# Patient Record
Sex: Female | Born: 1940 | Race: White | Hispanic: No | Smoking: Never smoker
Health system: Southern US, Community
[De-identification: ages and names within clinical notes are randomized; demographics above are authoritative.]

## PROBLEM LIST (undated history)

## (undated) DIAGNOSIS — K219 Gastro-esophageal reflux disease without esophagitis: Secondary | ICD-10-CM

## (undated) DIAGNOSIS — F32A Depression, unspecified: Secondary | ICD-10-CM

## (undated) DIAGNOSIS — M1990 Unspecified osteoarthritis, unspecified site: Secondary | ICD-10-CM

## (undated) DIAGNOSIS — G4730 Sleep apnea, unspecified: Secondary | ICD-10-CM

## (undated) DIAGNOSIS — E7800 Pure hypercholesterolemia, unspecified: Secondary | ICD-10-CM

## (undated) DIAGNOSIS — E669 Obesity, unspecified: Secondary | ICD-10-CM

## (undated) DIAGNOSIS — R0602 Shortness of breath: Secondary | ICD-10-CM

## (undated) DIAGNOSIS — M810 Age-related osteoporosis without current pathological fracture: Secondary | ICD-10-CM

## (undated) DIAGNOSIS — C4490 Unspecified malignant neoplasm of skin, unspecified: Secondary | ICD-10-CM

## (undated) DIAGNOSIS — F329 Major depressive disorder, single episode, unspecified: Secondary | ICD-10-CM

## (undated) DIAGNOSIS — F419 Anxiety disorder, unspecified: Secondary | ICD-10-CM

## (undated) HISTORY — DX: Unspecified osteoarthritis, unspecified site: M19.90

## (undated) HISTORY — PX: ABDOMINAL HYSTERECTOMY: SHX81

## (undated) HISTORY — DX: Obesity, unspecified: E66.9

## (undated) HISTORY — DX: Pure hypercholesterolemia, unspecified: E78.00

## (undated) HISTORY — DX: Age-related osteoporosis without current pathological fracture: M81.0

## (undated) HISTORY — DX: Anxiety disorder, unspecified: F41.9

## (undated) HISTORY — PX: OTHER SURGICAL HISTORY: SHX169

## (undated) HISTORY — PX: CHOLECYSTECTOMY: SHX55

## (undated) HISTORY — PX: CATARACT EXTRACTION: SUR2

---

## 1999-09-13 ENCOUNTER — Other Ambulatory Visit: Admission: RE | Admit: 1999-09-13 | Discharge: 1999-09-13 | Payer: Self-pay | Admitting: Obstetrics and Gynecology

## 2000-09-15 ENCOUNTER — Other Ambulatory Visit: Admission: RE | Admit: 2000-09-15 | Discharge: 2000-09-15 | Payer: Self-pay | Admitting: Obstetrics and Gynecology

## 2000-09-26 ENCOUNTER — Other Ambulatory Visit: Admission: RE | Admit: 2000-09-26 | Discharge: 2000-09-26 | Payer: Self-pay | Admitting: Obstetrics and Gynecology

## 2001-10-09 ENCOUNTER — Other Ambulatory Visit: Admission: RE | Admit: 2001-10-09 | Discharge: 2001-10-09 | Payer: Self-pay | Admitting: Obstetrics and Gynecology

## 2002-06-04 ENCOUNTER — Ambulatory Visit (HOSPITAL_COMMUNITY): Admission: RE | Admit: 2002-06-04 | Discharge: 2002-06-04 | Payer: Self-pay | Admitting: Gastroenterology

## 2002-12-20 ENCOUNTER — Encounter: Admission: RE | Admit: 2002-12-20 | Discharge: 2002-12-20 | Payer: Self-pay | Admitting: Internal Medicine

## 2002-12-20 ENCOUNTER — Encounter: Payer: Self-pay | Admitting: Internal Medicine

## 2004-01-09 ENCOUNTER — Other Ambulatory Visit: Admission: RE | Admit: 2004-01-09 | Discharge: 2004-01-09 | Payer: Self-pay | Admitting: Obstetrics and Gynecology

## 2005-05-06 ENCOUNTER — Inpatient Hospital Stay (HOSPITAL_COMMUNITY): Admission: AD | Admit: 2005-05-06 | Discharge: 2005-05-14 | Payer: Self-pay | Admitting: Internal Medicine

## 2005-05-11 ENCOUNTER — Ambulatory Visit: Payer: Self-pay | Admitting: Hematology and Oncology

## 2006-04-09 ENCOUNTER — Encounter: Admission: RE | Admit: 2006-04-09 | Discharge: 2006-04-09 | Payer: Self-pay | Admitting: Internal Medicine

## 2007-03-07 ENCOUNTER — Emergency Department (HOSPITAL_COMMUNITY): Admission: EM | Admit: 2007-03-07 | Discharge: 2007-03-07 | Payer: Self-pay | Admitting: Emergency Medicine

## 2007-03-09 ENCOUNTER — Ambulatory Visit (HOSPITAL_COMMUNITY): Admission: RE | Admit: 2007-03-09 | Discharge: 2007-03-09 | Payer: Self-pay | Admitting: Emergency Medicine

## 2010-08-09 ENCOUNTER — Emergency Department (HOSPITAL_COMMUNITY): Admission: EM | Admit: 2010-08-09 | Discharge: 2010-08-09 | Payer: Self-pay | Admitting: Emergency Medicine

## 2010-10-12 ENCOUNTER — Ambulatory Visit (HOSPITAL_COMMUNITY)
Admission: RE | Admit: 2010-10-12 | Discharge: 2010-10-12 | Payer: Self-pay | Source: Home / Self Care | Attending: Surgery | Admitting: Surgery

## 2010-10-15 LAB — CBC
HCT: 43.3 % (ref 36.0–46.0)
Hemoglobin: 14.8 g/dL (ref 12.0–15.0)
MCH: 31.8 pg (ref 26.0–34.0)
MCHC: 34.2 g/dL (ref 30.0–36.0)
MCV: 93.1 fL (ref 78.0–100.0)
Platelets: 208 10*3/uL (ref 150–400)
RBC: 4.65 MIL/uL (ref 3.87–5.11)
RDW: 13.5 % (ref 11.5–15.5)
WBC: 6.7 10*3/uL (ref 4.0–10.5)

## 2010-10-15 LAB — COMPREHENSIVE METABOLIC PANEL
ALT: 36 U/L — ABNORMAL HIGH (ref 0–35)
AST: 34 U/L (ref 0–37)
Albumin: 4.6 g/dL (ref 3.5–5.2)
Alkaline Phosphatase: 96 U/L (ref 39–117)
BUN: 14 mg/dL (ref 6–23)
CO2: 28 mEq/L (ref 19–32)
Calcium: 9.8 mg/dL (ref 8.4–10.5)
Chloride: 106 mEq/L (ref 96–112)
Creatinine, Ser: 0.87 mg/dL (ref 0.4–1.2)
GFR calc Af Amer: >60 mL/min (ref >60)
GFR calc non Af Amer: >60 mL/min (ref >60)
Glucose, Bld: 100 mg/dL — ABNORMAL HIGH (ref 70–99)
Potassium: 4.3 mEq/L (ref 3.5–5.1)
Sodium: 140 mEq/L (ref 135–145)
Total Bilirubin: 0.8 mg/dL (ref 0.3–1.2)
Total Protein: 6.7 g/dL (ref 6.0–8.3)

## 2010-10-15 LAB — SURGICAL PCR SCREEN
MRSA, PCR: NEGATIVE
Staphylococcus aureus: NEGATIVE

## 2010-12-11 LAB — URINALYSIS, ROUTINE W REFLEX MICROSCOPIC
Bilirubin Urine: NEGATIVE
Glucose, UA: NEGATIVE mg/dL
Hgb urine dipstick: NEGATIVE
Ketones, ur: NEGATIVE mg/dL
Nitrite: NEGATIVE
Protein, ur: NEGATIVE mg/dL
Specific Gravity, Urine: 1.02 (ref 1.005–1.030)
Urobilinogen, UA: 0.2 mg/dL (ref 0.0–1.0)
pH: 6.5 (ref 5.0–8.0)

## 2010-12-11 LAB — CBC
HCT: 39.1 % (ref 36.0–46.0)
Hemoglobin: 13.6 g/dL (ref 12.0–15.0)
MCH: 32.6 pg (ref 26.0–34.0)
MCHC: 34.8 g/dL (ref 30.0–36.0)
MCV: 93.5 fL (ref 78.0–100.0)
Platelets: 150 10*3/uL (ref 150–400)
RBC: 4.19 MIL/uL (ref 3.87–5.11)
RDW: 13.6 % (ref 11.5–15.5)
WBC: 8.1 10*3/uL (ref 4.0–10.5)

## 2010-12-11 LAB — COMPREHENSIVE METABOLIC PANEL
ALT: 27 U/L (ref 0–35)
AST: 30 U/L (ref 0–37)
Albumin: 3.7 g/dL (ref 3.5–5.2)
Alkaline Phosphatase: 79 U/L (ref 39–117)
Chloride: 106 mEq/L (ref 96–112)
Potassium: 3.9 mEq/L (ref 3.5–5.1)
Sodium: 139 mEq/L (ref 135–145)
Total Bilirubin: 0.8 mg/dL (ref 0.3–1.2)
Total Protein: 6.1 g/dL (ref 6.0–8.3)

## 2010-12-11 LAB — DIFFERENTIAL
Basophils Relative: 0 % (ref 0–1)
Eosinophils Absolute: 0 10*3/uL (ref 0.0–0.7)
Eosinophils Relative: 0 % (ref 0–5)
Monocytes Absolute: 0.4 10*3/uL (ref 0.1–1.0)
Monocytes Relative: 4 % (ref 3–12)

## 2011-02-15 NOTE — Consult Note (Signed)
NAMEHARBOUR, NORDMEYER              ACCOUNT NO.:  [id number]   MEDICAL RECORD NO.:  [id number]          PATIENT TYPE:  INP   LOCATION:  5503                         FACILITY:  [facility]   PHYSICIAN:  Adolph Pollack, M.D.DATE OF BIRTH:  August 10, 1941   DATE OF CONSULTATION:  05/06/2005  DATE OF DISCHARGE:                                   CONSULTATION   REASON FOR VISIT:  Abdominal pain and bloody diarrhea.   HISTORY OF PRESENT ILLNESS:  This is a 70 year old female with a six-day  history of bloody diarrhea and abdominal pain in the lower abdomen.  It  comes on as cramps before the diarrhea.  She had taken care of her  granddaughter a week prior to the onset of her illness, who had the exact  same thing, and this eventually resolved.  She was cleaning up her  granddaughter's diarrhea at that time.  She saw Dr. Pete Glatter and was sent  to the hospital for admission, and we were asked to see her.  She is very  thirsty, has not been eating or drinking much.   PAST MEDICAL HISTORY:  1.  Anxiety.  2.  Depression.  3.  Gastroesophageal reflux disease.   PREVIOUS OPERATIONS:  1.  Repair of nasal septum.  2.  TAH/BSO.  3.  Laparoscopy.   ALLERGIES:  None.   MEDICATIONS:  BuSpar, Celexa, Prilosec, aspirin, calcium, multivitamin.   SOCIAL HISTORY:  Denies tobacco or alcohol use.  Lives in [place].  Daughter and granddaughter are with her.  She works at the [employer].   REVIEW OF SYSTEMS:  CARDIOVASCULAR:  She denies any heart disease,  hypertension or vascular disease.  PULMONARY:  She denies asthma, pneumonia,  tuberculosis.  GASTROINTESTINAL:  She states she has diverticulosis and had  a colonoscopy by Tasia Catchings, M.D.  Denies hepatitis or diverticulitis.  ENDOCRINE:  Denies diabetes and hypercholesterolemia and no thyroid disease.  NEUROLOGIC:  Denies strokes or seizure.  HEMATOLOGIC:  Denies any known  bleeding disorders or blood clots.   PHYSICAL EXAMINATION:   GENERAL:  An anxious female but she does not appear  to be toxic.  VITAL SIGNS:  Temperature is 97.9, blood pressure is 120/70, pulse 70,  respiratory rate 20.  HEENT:  Extraocular motions intact.  No icterus.  Mucous membranes are dry.  NECK:  Supple without obvious masses.  RESPIRATORY:  Breath sounds equal and clear, respirations are unlabored.  CARDIOVASCULAR:  Regular rate and rhythm, no murmur, gallop or rub.  Strong  pedal pulses present.  ABDOMEN:  Soft.  There is some right lower quadrant and suprapubic  tenderness and mild left lower quadrant tenderness.  A mild amount of  guarding in the right lower quadrant region.  Hyperactive bowel sounds are  heard.  A lower transverse scar is noted.  A small subumbilical scar noted.  SKIN:  Warm and dry.  EXTREMITIES:  No edema.   LABORATORY DATA:  Pending but apparently over at Dr. Laverle Hobby office, she  had a potassium of 3.3 and a white blood cell count of 16,000.  CT scan has  been ordered by Dr. Pete Glatter.   IMPRESSION:  1.  Abdominal pain and bloody stools similar to a recent illness that her      granddaughter had, most likely infectious type of colitis versus      gastroenteritis.  Other potential etiologies include diverticulitis with      bleeding or ischemic colitis.  2.  Severe dehydration.  3.  Hypokalemia.   RECOMMENDATIONS:  1.  Would keep n.p.o.  2.  Agree with the CT of the abdomen and pelvis.  3.  Labs have been ordered and are pending.  4.  Would aggressively hydrate her.  5.  Will check on the CT after it has been done.       TJR/MEDQ  D:  05/06/2005  T:  05/07/2005  Job:  [id number]   cc:   Hal T. Stoneking, M.D.  [address] [place], [place] [address]  Fax: [phone]

## 2011-02-15 NOTE — Consult Note (Signed)
NAME:  Christie Harris, Christie Harris              ACCOUNT NO.:  [id number]   MEDICAL RECORD NO.:  [id number]          PATIENT TYPE:  INP   LOCATION:  5014                         FACILITY:  [facility]   PHYSICIAN:  Lauretta I. Odogwu, M.D.DATE OF BIRTH:  1940-10-26   DATE OF CONSULTATION:  05/11/2005  DATE OF DISCHARGE:                                   CONSULTATION   REASON FOR CONSULTATION:  Thrombocytopenia.   REFERRING PHYSICIAN:  Dr. Ladona Ridgel.   HISTORY OF PRESENT ILLNESS:  Christie Harris is a 70 year old, white female  admitted to [facility] with a 6-day history of bloody diarrhea and  lower abdominal pain following outpatient evaluation.  Her grand daughter  had contacted a similar illness but recovered. She received aggressive  hydration and electrolyte replenishment.  A CT of the abdomen performed  showed significant thickening of the wall of the descending colon and  pericolonic inflammatory changes.  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  She was placed on IV antibiotics.  CBC on  admission, May 06, 2005, was normal with a hemoglobin of 13.6 and  hematocrit of 39.1, platlets 245.  Platlets began a steady decline; May 10, 2005 platlets 125, August 12, platlets 89.  Her peripheral smear demonstrates very few schizocytes and helmet cells, and  was oterwise normal.   PAST MEDICAL HISTORY:  1.  Anxiety disorder.  2.  Depression.  3.  GERD.  4.  Borderline hypertension.  5.  Mild obesity.   PAST SURGICAL HISTORY:  1.  Status post repair of nasal septum.  2.  Status post TAH/BSO.  3.  Status post laparoscopy.   ALLERGIES:  No known drug allergies.   MEDICATIONS:  1.  BuSpar 50 mg b.i.d.  2.  Calcium carbonate 300 mg q.d.  3.  Cipro IV 400 mg q.12h.  4.  Celexa 20 mg q.d.  5.  Flagyl 500 mg q.d.  6.  Protonix 40 mg q.d.  7.  Zofran 4 mg q.4h. p.r.n.  8.  Lortab p.r.n.  9.  Morphine sulfate p.r.n.  10. Tylenol p.r.n.  11. IV fluids at  50 an hour.   REVIEW OF SYSTEMS:  CONSTITUTIONAL:  Fatigue, headaches until last night.  GASTROINTESTINAL:  Abdominal pain and diarrhea resolving. Pateint denies  nausea.  GENITOURINARY:  Denies hematuria and nocturia. The rest of the  review of systems is negative.   FAMILY HISTORY:  Mother died with some type of cancer at 66.  Father died at  74 with CHF.  No history of cancer or hematologic malignancies in the  family.   SOCIAL HISTORY:  The patient is divorced.  She has one daughter and one son  in good health.  She works as a Comptroller.  She lives in [place].  Denies  alcohol and tobacco use.   PHYSICAL EXAMINATION:  GENERAL:  This is a 70 year old, white female in no  acute distress, highly anxious and tearful, alert and oriented x3.  VITAL SIGNS:  Blood pressure 134/68, pulse 76, respirations 20, temperature  97.7, weight 79 kg, height 67 inches.  SKIN:  Normocephalic, atraumatic.  PERRLA.  Oral mucosa without thrush or  lesions.  NECK:  Supple.  No cervical or supraclavicular masses.  LUNGS:  Decreased breath sounds at the bases.  No wheezes, no rhonchi, no  rales, no axillary masses.  CARDIAC:  Regular rate and rhythm without murmurs, rubs or gallops.  ABDOMEN:  Distended, soft, and mildly tender at the epigastrium.  No  palpable spleen or liver. Bowel sounds present.  EXTREMITIES:  No clubbing, cyanosis or edema.  SKIN:  Without lesions, bruising or petechiae.  NEUROLOGIC:  Nonfocal.   LABORATORY DATA AND X-RAY FINDINGS:  Hemoglobin 10.4, hematocrit 29.4, white  count 6.3, platelets 89, neutrophils 4.8, MCV 90.5.  PT 16.4, PTT 35, INR  1.3.  Fibrinogen 470.  D-dimer 3.27.  LDH, reticulocyte count and  haptoglobin is pending.  Sodium 139, potassium 3.8, BUN 16, creatinine 1.3,  glucose 108.  Total bilirubin 1.9, Alk phos 72, AST 46, ALT 24, total  protein 4.2, albumin 2.2, calcium 8.0.   RECOMMENDATIONS:  TTP/HUS is not consistent with the findings.  No evidence  of  acute disseminated intravascular coagulation.  The findings are  consistent with underlying illness, primarily infection.  Will follow the  counts daily.  Will check her LDH.  Repeat the smear.  Check her hepatitis  panel based on abnormal LFTs.   Thank you very much for allowing [place] the opportunity to participate in Ms.  Harris's care.      Marlowe Kays, P.A.      Lauretta I. Odogwu, M.D.  Electronically Signed    SW/MEDQ  D:  05/13/2005  T:  05/13/2005  Job:  [id number]   cc:   Bernette Redbird, M.D.  [address] [place]  [place], [place] [address]  Fax: [phone]

## 2011-02-15 NOTE — Discharge Summary (Signed)
NAME:  Christie Harris, Christie Harris              ACCOUNT NO.:  [id number]   MEDICAL RECORD NO.:  [id number]          PATIENT TYPE:  INP   LOCATION:  5014                         FACILITY:  [facility]   PHYSICIAN:  Melissa L. Ladona Ridgel, MD  DATE OF BIRTH:  11-30-1940   DATE OF ADMISSION:  05/06/2005  DATE OF DISCHARGE:  05/14/2005                                 DISCHARGE SUMMARY   DISCHARGE DIAGNOSES:  1.  Enteritis with diarrhea.  Patient was admitted from the general      practitioner's office with the acute onset of abdominal pain and bloody      diarrhea.  She had had exposure to a granddaughter who had similar      symptoms.  The patient was found to be significantly dehydrated and      therefore was admitted for further care to the hospital.  Patient's      diarrhea ultimately resolved, however, no organism was noted to be      growing in her cultures.  She was initially treated with IV antibiotics.      These, at the end of the course of her stay, were discontinued.  She was      evaluated by GI and follow-up arrangements were made at the time of      discharge for her as an outpatient.  2.  Anxiety and depression.  The patient's BuSpar and Celexa needed to be      held secondary to her inability to keep food down.  As a result of off      her BuSpar, the patient developed a significant neurological tremor at      the end of her discharge.  CT of the head was negative and with      resumption of her medication, the tremor was reported at the time of      discharge to be improved, and likely represented withdraw symptoms from      her medications.  3.  Thrombocytopenia.  The patient received deep venous thrombosis      prophylaxis during the course of hospital stay but developed a decrease      in her platelets which was evaluated by hematology/oncology.  It was      felt that her condition was likely secondary to critical illness and not      consistent with thrombotic thrombocytopenia  purpura-hemolytic uremic      syndrome.  Her liver function tests, LDH, PT/INR were therefore followed      with improvement by the time of discharge.  4.  Lower extremity pain.  Patient was evaluated for deep venous thrombosis.      Dopplers and lower extremities showed no evidence for deep venous      thrombosis.  5.  Slight renal insufficiency.  The patient was rehydrated with good      results.  No further elevations in her creatinine were noted.   DISCHARGE MEDICATIONS:  1.  BuSpar 50 mg b.i.d.  2.  Celexa 20 mg daily.  3.  Prilosec 20 mg daily.  4.  Multivitamin once daily.  5.  Calcium 650 daily.  6.  Aspirin was to be held for two weeks.   Patient is to follow up with GI in two weeks and to call her primary care  physician for an appointment.   HISTORY OF PRESENT ILLNESS:  Patient was a 70 year old white female who had  recently cared for her granddaughter who developed severe diarrhea.  The  patient, in question, soon thereafter developed profuse diarrhea with blood.  She, therefore, was admitted to the hospital for further rehydration and  care.  During the course of the hospital stay, the patient was evaluated by  critical care surgery for right-sided abdominal pain and bloating.  She was  conservatively treated with IV fluids and bowel rest.  A GI consult was  requested and it was decided to add Flagyl to her Cipro and add Probiotic.  Patient did not undergo colonoscopy initially and it was decided that she  would follow up as an outpatient for further testing.  Patient's course was  mired by increased abdominal distention which was remedied with rectal  decompression.  She continued to be treated with Cipro and Flagyl.  The  patient's stool cultures grew no specific bacteria.   On May 10, 2005, patient's platelets were noted to be decreased with mild  elevation in her BUN and creatinine.   On May 11, 2005, patient was evaluated for possible TTP and HUS.  The   final outcome determined that the patient likely had thrombocytopenia  secondary to critical illness.  The patient continued to be treated with IV  antibiotics and her laboratory values were monitored.  Her colitis appeared  to be resolving with mild lingering ileus.   On May 12, 2005, consideration was made of discontinuing her antibiotics  to follow her white counts and fevers.  She was placed on a lactose-free  diet and progressed on to full diet before discharge.  Of note, the patient  developed an intention tremor which was quite severe and debilitating.  With  the replacement of her BuSpar and Celexa as well as mild anti-anxiolytic,  the patient's symptoms resolved by the time of discharge.   On May 14, 2005, the patient is noted to be stable, in no acute distress.  Cardiovascular regular rate and rhythm.  Abdomen nontender.  Neurologically,  she has improved intention tremor.  It was therefore decided the patient  could be discharged to home with follow-up as an outpatient.   PERTINENT LABORATORY DATA:  Discharge white count 5.9 with hemoglobin 9.7,  hematocrit 27.9, and platelets 111.  Her sodium was 140, potassium 4.0,  chloride 111, CO2 26, glucose 102, BUN 19, creatinine 1.3.  Haptoglobin was  less than 6.  Folate was normal.  Her urinalysis was negative.  Blood  cultures were negative.  Stool cultures grew no Salmonella shigellae,  campylobacter or Yersinia and her C. difficile toxin was negative.  Her  random blood urea nitrogen level was 360 within normal limits.   Patient had a Doppler that showed no evidence of DVT, cyst or thrombosis.  CT of the abdomen was performed that showed marked wall thickening of the  ascending colon, likely due to infectious colitis.  She had a small right  pleural effusion.   At the time of discharge, my partner felt that she was stable enough to  follow up as an outpatient.  We therefore discharged to home.  At this time, the patient  is deemed stable to follow up with her primary  care  physician, Dr. Ladell Pier.      Melissa L. Ladona Ridgel, MD  Electronically Signed     MLT/MEDQ  D:  07/20/2005  T:  07/21/2005  Job:  [id number]   cc:   Ladell Pier, M.D.  Fax: [phone]

## 2011-02-15 NOTE — H&P (Signed)
NAME:  Christie Harris, Christie Harris              ACCOUNT NO.:  [id number]   MEDICAL RECORD NO.:  [id number]          PATIENT TYPE:  INP   LOCATION:  5503                         FACILITY:  [facility]   PHYSICIAN:  Hal T. Stoneking, M.D. DATE OF BIRTH:  1941-04-13   DATE OF ADMISSION:  05/06/2005  DATE OF DISCHARGE:                                HISTORY & PHYSICAL   CHIEF COMPLAINT:  A 70 year old white female with a one-week history of  bloody diarrhea.  She developed nausea and vomiting over the last 24-48  hours and along with general abdominal pain and swelling, and decided to be  seen.   A granddaughter had similar symptoms which resolved spontaneously.   Laboratories in the office showed dehydration and infection with a sodium of  129, potassium of 3.3, and a white count of 16,100.   She has no food source, water source, travel, or antibiotics.   PAST MEDICAL HISTORY:   ALLERGIES:  NKDA.   CURRENT MEDICATIONS:  1. BuSpar 15 mg b.i.d.  2. Celexa 20 mg one daily.  3. Prilosec over-the-counter 20 mg one daily.  4. Multivitamin one daily.  5. Calcium 600 mg q.a.m.  6. Aspirin 81 mg one daily.   MEDICAL HISTORY:  1. GERD.  2. Anxiety.  3. Depression.  4. Postmenopausal.  5. Headache.  6. Borderline blood pressure.  7. Mild obesity.   SURGERIES:  1. Nasal septum repair.  2. Status post total abdominal hysterectomy with bilateral salpingo-      oophorectomy.   FAMILY HISTORY:  Mother died at age 62 of unknown cancer with GI problems as  well including peptic ulcer and bleeding ulcer.  Father died at age 72 of  CHF.   SOCIAL HISTORY:  She never smoked.  She drinks occasionally.  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  She has one son and one  daughter and she is divorced.  She is raising her grandchild.   REVIEW OF SYSTEMS:  Noncontributory except for chief complaint.   PHYSICAL EXAMINATION:  GENERAL APPEARANCE:  Acutely ill female in acute  distress.  VITAL SIGNS:  Temperature 97.9, pulse 72, respirations 18, blood pressure  120/70.  HEENT:  PERRL.  Ears clear.  Nose and throat clear.  NECK:  Supple.  CHEST:  Clear to auscultation.  BREASTS:  Deferred.  ABDOMEN:  Distended, tender generally with positive peritoneal sign.  No  masses or organomegaly.  PELVIC:  Deferred.  RECTAL:  Guaiac-positive.  EXTREMITIES:  No edema.   IMPRESSION:  Abdominal pain with bloody diarrhea, questionable etiology.  Admit for intravenous hydration and evaluation of abdominal pain including a  surgical consult.      Mary   MAP/MEDQ  D:  05/06/2005  T:  05/06/2005  Job:  [id number]

## 2011-02-15 NOTE — Consult Note (Signed)
NAME:  Christie Harris, ROCQUE              ACCOUNT NO.:  [id number]   MEDICAL RECORD NO.:  [id number]          PATIENT TYPE:  INP   LOCATION:  5503                         FACILITY:  [facility]   PHYSICIAN:  Bernette Redbird, M.D.   DATE OF BIRTH:  05/26/1941   DATE OF CONSULTATION:  05/07/2005  DATE OF DISCHARGE:                                   CONSULTATION   Dr. Derenda Mis the [facility] asked [place] to see this 70 year old  female because of hemorrhagic diarrhea.   Christie Harris was admitted to the hospital yesterday following a roughly one-  week prodrome of diarrhea and diffuse body aches associated with minimal  vomiting.  The diarrhea began fairly abruptly, about a week after her  granddaughter, with whom she had taken a trip to the beach, came down with a  similar illness.  The diarrhea became bloody in character, up to as many as  eight times a day, including nocturnal diarrhea, associated with significant  low abdominal discomfort which has now migrated somewhat more superiorly,  although the diarrhea is much improved (only one bowel movement so far  today, and it was nonbloody).   A CT scan obtained on admission showed significant thickening of the wall of  the descending colon with pericolonic inflammatory changes.   No obvious risk factors for infectious colitis such as foreign travel, no  family history of inflammatory bowel disease.   PAST MEDICAL HISTORY:  No allergies.   OUTPATIENT MEDICATIONS:  BuSpar, Celexa, Prilosec OTC, calcium supplements,  daily aspirin, and p.r.n. NSAIDS.   OPERATIONS:  Remote hysterectomy and oophorectomy.   MEDICAL PROBLEMS:  Anxiety and depression, borderline blood pressure, GERD.   HABITS:  Moderate ethanol, nonsmoker.   FAMILY HISTORY:  Negative for GI illnesses.   SOCIAL HISTORY:  Works as a Comptroller at [employer].   REVIEW OF SYSTEMS:  The patient did have some diffuse body aches in  association with the current illness,  but no mouth sores or joint effusions.  At baseline, she averages one bowel movement daily.  She has had a screening  colonoscopy within the past five years by Dr. Sherin Quarry.   PHYSICAL EXAMINATION:  GENERAL:  A very pleasant, articulate Caucasian  female in no evident distress, appearing neither anxious nor depressed.  VITAL SIGNS:  Pertinent for absence of fever since admission, pulse 85,  blood pressure 112/63.  SKIN:  Anicteric. No frank pallor.  CHEST:  Clear.  HEART:  Normal.  ABDOMEN:  Normal bowel sounds, is slightly protuberant and, by the patient's  report, somewhat distended, slightly firm but not tense or rigid, no  exquisite tenderness.  No rebound.  RECTAL:  No evident perianal disease.  Bimanual palpation of the coccygeal  region unremarkable (the patient has sensed a fullness in that area  recently).  No obvious masses in the rectum, and the rectal ampulla was  empty and devoid of stool but there was mucoid brown liquid stool on the  examining glove.   Labs white count 11,500 (was 15,600 yesterday) post-hydration hemoglobin  11.4, platelets 209,000.  INR normal.  Chemistry profile pertinent  for  normal liver chemistries, albumin 2.8.  On presentation, BUN was 16 but it  has fallen to 9 with hydration, creatinine 0.9, potassium 3.1 on admission,  3.6 today.   CT scan:  I reviewed the CT scan with the radiologist and concur with the  impression of proximal colonic thickening.  No obvious vascular  calcifications to cause is an ischemic colitis.  No toxic dilatation of the  colon.   IMPRESSION:  By history, this is most compatible with an infectious  hemorrhagic (dysenteric) colitis with associated diarrhea, currently in the  resolving phase.   RECOMMENDATIONS:  Supportive care.  I agree with the empiric use of  antibiotics for the time being and would add Flagyl to the regimen of Cipro  which was started yesterday.  The Flagyl has subsequently been started   following my discussion with Dr. Derenda Mis.  I would also favor  probiotic supplementation and trying the patient on sips of fluid with the  hope that a clear liquid diet could be tolerated tomorrow.   At present, I would hold off on colonoscopy because it would be at increased  risk for complications given the inflamed status of the colon.  However, I  would consider colonoscopy if the patient markedly worsens and our hand is  forced or if the current clinical problem persists and lingers, which I  doubt will occur.   I appreciate the opportunity of seeing this patient in consultation with  you.       RB/MEDQ  D:  05/07/2005  T:  05/08/2005  Job:  [id number]   cc:   Hal T. Stoneking, M.D.  [address] [place], [place] [address]  Fax: [phone]

## 2011-07-18 LAB — D-DIMER, QUANTITATIVE: D-Dimer, Quant: 0.28

## 2011-07-18 LAB — I-STAT 8, (EC8 V) (CONVERTED LAB)
Acid-Base Excess: 2
Chloride: 108
HCT: 42
Potassium: 4.2
pH, Ven: 7.419 — ABNORMAL HIGH

## 2011-07-18 LAB — COMPREHENSIVE METABOLIC PANEL
ALT: 33
BUN: 18
CO2: 27
Calcium: 9.4
Creatinine, Ser: 0.91
GFR calc non Af Amer: >60
Glucose, Bld: 109 — ABNORMAL HIGH
Sodium: 141

## 2011-07-18 LAB — POCT CARDIAC MARKERS
Myoglobin, poc: 137
Operator id: 151321

## 2011-07-18 LAB — LIPASE, BLOOD: Lipase: 30

## 2011-07-18 LAB — POCT I-STAT CREATININE: Creatinine, Ser: 1

## 2012-11-25 ENCOUNTER — Other Ambulatory Visit: Payer: Self-pay | Admitting: Gastroenterology

## 2013-01-13 ENCOUNTER — Other Ambulatory Visit: Payer: Self-pay | Admitting: Gastroenterology

## 2013-03-08 ENCOUNTER — Encounter (HOSPITAL_COMMUNITY): Payer: Self-pay | Admitting: *Deleted

## 2013-03-11 ENCOUNTER — Encounter (HOSPITAL_COMMUNITY): Payer: Self-pay | Admitting: Pharmacy Technician

## 2013-03-23 ENCOUNTER — Encounter (HOSPITAL_COMMUNITY): Payer: Self-pay | Admitting: *Deleted

## 2013-03-23 ENCOUNTER — Ambulatory Visit (HOSPITAL_COMMUNITY)
Admission: RE | Admit: 2013-03-23 | Discharge: 2013-03-23 | Disposition: A | Discharge status: Home / Self Care | Payer: Medicare PPO | Source: Ambulatory Visit | Attending: Gastroenterology | Admitting: Gastroenterology

## 2013-03-23 ENCOUNTER — Encounter (HOSPITAL_COMMUNITY): Payer: Self-pay | Admitting: Anesthesiology

## 2013-03-23 ENCOUNTER — Ambulatory Visit (HOSPITAL_COMMUNITY): Payer: Medicare PPO | Admitting: Anesthesiology

## 2013-03-23 ENCOUNTER — Encounter (HOSPITAL_COMMUNITY)
Admission: RE | Disposition: A | Discharge status: Home / Self Care | Payer: Self-pay | Source: Ambulatory Visit | Attending: Gastroenterology

## 2013-03-23 DIAGNOSIS — E7800 Pure hypercholesterolemia, unspecified: Secondary | ICD-10-CM | POA: Insufficient documentation

## 2013-03-23 DIAGNOSIS — G4733 Obstructive sleep apnea (adult) (pediatric): Secondary | ICD-10-CM | POA: Insufficient documentation

## 2013-03-23 DIAGNOSIS — M810 Age-related osteoporosis without current pathological fracture: Secondary | ICD-10-CM | POA: Insufficient documentation

## 2013-03-23 DIAGNOSIS — Z1211 Encounter for screening for malignant neoplasm of colon: Secondary | ICD-10-CM | POA: Insufficient documentation

## 2013-03-23 DIAGNOSIS — D126 Benign neoplasm of colon, unspecified: Secondary | ICD-10-CM | POA: Insufficient documentation

## 2013-03-23 DIAGNOSIS — K219 Gastro-esophageal reflux disease without esophagitis: Secondary | ICD-10-CM | POA: Insufficient documentation

## 2013-03-23 HISTORY — DX: Unspecified osteoarthritis, unspecified site: M19.90

## 2013-03-23 HISTORY — PX: COLONOSCOPY WITH PROPOFOL: SHX5780

## 2013-03-23 HISTORY — DX: Shortness of breath: R06.02

## 2013-03-23 HISTORY — DX: Major depressive disorder, single episode, unspecified: F32.9

## 2013-03-23 HISTORY — DX: Gastro-esophageal reflux disease without esophagitis: K21.9

## 2013-03-23 HISTORY — DX: Sleep apnea, unspecified: G47.30

## 2013-03-23 HISTORY — DX: Depression, unspecified: F32.A

## 2013-03-23 SURGERY — COLONOSCOPY WITH PROPOFOL
Anesthesia: Monitor Anesthesia Care

## 2013-03-23 MED ORDER — LACTATED RINGERS IV SOLN
INTRAVENOUS | Status: DC
  Administered 2013-03-23: 1000 mL via INTRAVENOUS

## 2013-03-23 MED ORDER — SODIUM CHLORIDE 0.9 % IV SOLN: INTRAVENOUS | Status: DC

## 2013-03-23 MED ORDER — PROMETHAZINE HCL 25 MG/ML IJ SOLN: 6.2500 mg | INTRAMUSCULAR | Status: DC | PRN

## 2013-03-23 MED ORDER — PROPOFOL INFUSION 10 MG/ML OPTIME
INTRAVENOUS | Status: DC | PRN
  Administered 2013-03-23: 75 ug/kg/min via INTRAVENOUS

## 2013-03-23 MED ORDER — KETAMINE HCL 50 MG/ML IJ SOLN
INTRAMUSCULAR | Status: DC | PRN
  Administered 2013-03-23: 10 mg via INTRAMUSCULAR
  Administered 2013-03-23: 15 mg via INTRAMUSCULAR

## 2013-03-23 MED ORDER — PROPOFOL 10 MG/ML IV BOLUS
INTRAVENOUS | Status: DC | PRN
  Administered 2013-03-23: 50 mg via INTRAVENOUS
  Administered 2013-03-23 (×2): 20 mg via INTRAVENOUS

## 2013-03-23 SURGICAL SUPPLY — 21 items

## 2013-03-23 NOTE — H&P (Signed)
  Procedure: Surveillance colonoscopy  History: The patient is a 72 year old female born 1941-09-07. The patient has a history of adenomatous colon polyps removed colonoscopically. She is scheduled to undergo a surveillance colonoscopy today.  Past medical history: Obstructive sleep apnea syndrome. Depression. Hypercholesterolemia. Gastroesophageal reflux. Osteoporosis. Bilateral hearing loss. Hysterectomy. Oophorectomy. Tonsillectomy. Cholecystectomy. Nasal reconstruction surgery to  Habits: Darvocet causes hallucinations.  Exam: The patient is alert and lying comfortably on the endoscopy stretcher. Lungs are clear to auscultation. Cardiac exam reveals a regular rhythm. Abdomen is soft, flat, and nontender to palpation.  Plan: Proceed with surveillance colonoscopy

## 2013-03-23 NOTE — Op Note (Signed)
Procedure: Surveillance colonoscopy  Endoscopist: Danise Edge  Premedication: Propofol administered by anesthesia  Procedure: The patient was placed in the left lateral decubitus position. Anal inspection and digital rectal exam were normal. The Pentax pediatric colonoscope was introduced into the rectum and advanced to the cecum. A normal-appearing ileocecal valve and appendiceal orifice were identified. Colonic preparation for the exam today was good.  Rectum. Normal. Retroflexed view of the distal rectum normal.  Sigmoid colon and descending colon. Left colonic diverticulosis. A 3 mm sessile polyp was removed from the distal sigmoid colon with the cold biopsy forceps.  Splenic flexure. Normal.  Transverse colon. From the mid transverse colon a 4 mm sessile polyp was removed with the cold biopsy forceps  Hepatic flexure. Normal.  Ascending colon. Normal.  Cecum and ileocecal valve. Normal.  Assessment: A small polyp was removed from the transverse colon and a small polyp was removed from the distal sigmoid colon. Both polyps were submitted in one bottle for pathological evaluation.  Recommendations: Schedule surveillance colonoscopy in 5 years.

## 2013-03-23 NOTE — Transfer of Care (Signed)
Immediate Anesthesia Transfer of Care Note  Patient: Christie Harris  Procedure(s) Performed: Procedure(s) (LRB): COLONOSCOPY WITH PROPOFOL (N/A)  Patient Location: PACU  Anesthesia Type: MAC  Level of Consciousness: sedated, patient cooperative and responds to stimulaton  Airway & Oxygen Therapy: Patient Spontanous Breathing and Patient connected to face mask oxgen  Post-op Assessment: Report given to PACU RN and Post -op Vital signs reviewed and stable  Post vital signs: Reviewed and stable  Complications: No apparent anesthesia complications

## 2013-03-23 NOTE — Anesthesia Postprocedure Evaluation (Signed)
  Anesthesia Post-op Note  Patient: Christie Harris  Procedure(s) Performed: Procedure(s) (LRB): COLONOSCOPY WITH PROPOFOL (N/A)  Patient Location: PACU  Anesthesia Type: MAC  Level of Consciousness: awake and alert   Airway and Oxygen Therapy: Patient Spontanous Breathing  Post-op Pain: mild  Post-op Assessment: Post-op Vital signs reviewed, Patient's Cardiovascular Status Stable, Respiratory Function Stable, Patent Airway and No signs of Nausea or vomiting  Last Vitals:  Filed Vitals:   03/23/13 1309  BP:   Temp: 36.6 C  Resp:     Post-op Vital Signs: stable   Complications: No apparent anesthesia complications

## 2013-03-23 NOTE — Anesthesia Preprocedure Evaluation (Signed)
Anesthesia Evaluation  Patient identified by MRN, date of birth, ID band Patient awake    Reviewed: Allergy & Precautions, H&P , NPO status , Patient's Chart, lab work & pertinent test results  Airway Mallampati: II TM Distance: >3 FB Neck ROM: Full    Dental no notable dental hx.    Pulmonary sleep apnea ,  breath sounds clear to auscultation  Pulmonary exam normal       Cardiovascular negative cardio ROS  Rhythm:Regular Rate:Normal     Neuro/Psych negative neurological ROS  negative psych ROS   GI/Hepatic negative GI ROS, Neg liver ROS,   Endo/Other  negative endocrine ROS  Renal/GU negative Renal ROS  negative genitourinary   Musculoskeletal negative musculoskeletal ROS (+)   Abdominal   Peds negative pediatric ROS (+)  Hematology negative hematology ROS (+)   Anesthesia Other Findings   Reproductive/Obstetrics negative OB ROS                           Anesthesia Physical Anesthesia Plan  ASA: II  Anesthesia Plan: MAC   Post-op Pain Management:    Induction: Intravenous  Airway Management Planned: Nasal Cannula  Additional Equipment:   Intra-op Plan:   Post-operative Plan:   Informed Consent: I have reviewed the patients History and Physical, chart, labs and discussed the procedure including the risks, benefits and alternatives for the proposed anesthesia with the patient or authorized representative who has indicated his/her understanding and acceptance.     Plan Discussed with: CRNA and Surgeon  Anesthesia Plan Comments:         Anesthesia Quick Evaluation

## 2013-03-24 ENCOUNTER — Encounter (HOSPITAL_COMMUNITY): Payer: Self-pay | Admitting: Gastroenterology

## 2013-05-18 ENCOUNTER — Other Ambulatory Visit: Payer: Self-pay | Admitting: Radiology

## 2013-07-12 ENCOUNTER — Encounter: Payer: Self-pay | Admitting: Cardiology

## 2013-07-12 ENCOUNTER — Encounter: Payer: Self-pay | Admitting: *Deleted

## 2013-07-15 ENCOUNTER — Encounter: Payer: Self-pay | Admitting: Cardiology

## 2013-07-15 ENCOUNTER — Ambulatory Visit (INDEPENDENT_AMBULATORY_CARE_PROVIDER_SITE_OTHER): Payer: Medicare PPO | Admitting: Cardiology

## 2013-07-15 VITALS — BP 126/64 | HR 86 | Ht 66.0 in | Wt 198.0 lb

## 2013-07-15 DIAGNOSIS — F329 Major depressive disorder, single episode, unspecified: Secondary | ICD-10-CM | POA: Insufficient documentation

## 2013-07-15 DIAGNOSIS — F32A Depression, unspecified: Secondary | ICD-10-CM | POA: Insufficient documentation

## 2013-07-15 DIAGNOSIS — K219 Gastro-esophageal reflux disease without esophagitis: Secondary | ICD-10-CM | POA: Insufficient documentation

## 2013-07-15 DIAGNOSIS — G4733 Obstructive sleep apnea (adult) (pediatric): Secondary | ICD-10-CM | POA: Insufficient documentation

## 2013-07-15 DIAGNOSIS — E669 Obesity, unspecified: Secondary | ICD-10-CM

## 2013-07-15 NOTE — Progress Notes (Signed)
[address] [place], [place]  [address] Phone: [phone] Fax:  [phone]  Date:  07/15/2013   ID:  Christie Harris, DOB [birth year], MRN [id number]  PCP:  Katy Apo, MD  Sleep Medicine:  Armanda Magic, MD   History of Present Illness: Christie Harris is a 72 y.o. female with a history of obesity, OSA on CPAP.  She is tolerating her CPAP well.  She tolerates the mask well and feels the pressure is adequate.  She feels rested when she gets up and has no daytime sleepiness.    Wt Readings from Last 3 Encounters:  03/23/13 195 lb (88.451 kg)  03/23/13 195 lb (88.451 kg)     Past Medical History  Diagnosis Date  . Sleep apnea     CPAP at 13cm H2o  . GERD (gastroesophageal reflux disease)   . Arthritis   . Depression   . Shortness of breath     on exertion  . Hypercholesteremia   . Depression   . Anxiety   . Sleep apnea   . Osteoarthrosis   . Obesity   . Osteoporosis     Current Outpatient Prescriptions  Medication Sig Dispense Refill  . aspirin 81 MG tablet Take 81 mg by mouth daily.      . busPIRone (BUSPAR) 15 MG tablet Take 15 mg by mouth 2 (two) times daily.      . calcium carbonate (OS-CAL) 600 MG TABS Take 600 mg by mouth daily.      . citalopram (CELEXA) 20 MG tablet Take 20 mg by mouth every evening.      . ergocalciferol (VITAMIN D2) 50000 UNITS capsule Take 50,000 Units by mouth once a week. Weekly on friday      . ibandronate (BONIVA) 150 MG tablet Take 150 mg by mouth every 30 (thirty) days. Take in the morning with a full glass of water, on an empty stomach, and do not take anything else by mouth or lie down for the next 30 min.  Takes on Last day of month      . ibuprofen (ADVIL,MOTRIN) 200 MG tablet Take 200-400 mg by mouth every 6 (six) hours as needed for pain.      . Multiple Vitamins-Minerals (MULTIVITAMIN WITH MINERALS) tablet Take 1 tablet by mouth daily.      Marland Kitchen omeprazole (PRILOSEC) 20 MG capsule Take 20 mg by mouth every  evening.      . rosuvastatin (CRESTOR) 5 MG tablet Take 5 mg by mouth every evening.       No current facility-administered medications for this visit.    Allergies:    Allergies  Allergen Reactions  . Darvocet [Propoxyphene-Acetaminophen] Other (See Comments)    "out of my mind"  . Scallops [Shellfish Allergy] Nausea And Vomiting    Social History:  The patient  reports that she has never smoked. She has never used smokeless tobacco. She reports that she does not drink alcohol or use illicit drugs.   Family History:  The patient's family history is not on file.   ROS:  Please see the history of present illness.      All other systems reviewed and negative.   PHYSICAL EXAM: VS:  There were no vitals taken for this visit. Well nourished, well developed, in no acute distress HEENT: normal Neck: no JVD Cardiac:  normal S1, S2; RRR; no murmur Lungs:  clear to auscultation bilaterally, no wheezing, rhonchi or rales Abd: soft, nontender, no hepatomegaly  Ext: no edema Skin: warm and dry Neuro:  CNs 2-12 intact, no focal abnormalities noted       ASSESSMENT AND PLAN:  1. OSA on CPAP therapy  - I will get a CPAP d/l from her DME 2. Obesity  - I have encouraged her to increase her exercise and work on her diet with portion control.  Followup with me in 6 months  Signed, Armanda Magic, MD 07/15/2013 2:44PM

## 2013-07-15 NOTE — Patient Instructions (Signed)
Your physician recommends that you continue on your current medications as directed. Please refer to the Current Medication list given to you today.   Your physician recommends that you schedule a follow-up appointment in: 6 months with Dr. Mayford Knife for sleep.

## 2013-10-14 ENCOUNTER — Encounter: Payer: Self-pay | Admitting: Cardiology

## 2013-10-14 ENCOUNTER — Encounter: Payer: Self-pay | Admitting: General Surgery

## 2014-01-26 ENCOUNTER — Other Ambulatory Visit: Payer: Self-pay

## 2014-04-15 ENCOUNTER — Emergency Department (HOSPITAL_COMMUNITY): Payer: Medicare PPO

## 2014-04-15 ENCOUNTER — Encounter (HOSPITAL_COMMUNITY): Payer: Self-pay | Admitting: Emergency Medicine

## 2014-04-15 ENCOUNTER — Emergency Department (HOSPITAL_COMMUNITY)
Admission: EM | Admit: 2014-04-15 | Discharge: 2014-04-15 | Disposition: A | Discharge status: Home / Self Care | Payer: Medicare PPO | Attending: Emergency Medicine | Admitting: Emergency Medicine

## 2014-04-15 DIAGNOSIS — R141 Gas pain: Secondary | ICD-10-CM | POA: Insufficient documentation

## 2014-04-15 DIAGNOSIS — G4730 Sleep apnea, unspecified: Secondary | ICD-10-CM | POA: Insufficient documentation

## 2014-04-15 DIAGNOSIS — IMO0001 Reserved for inherently not codable concepts without codable children: Secondary | ICD-10-CM | POA: Insufficient documentation

## 2014-04-15 DIAGNOSIS — F329 Major depressive disorder, single episode, unspecified: Secondary | ICD-10-CM

## 2014-04-15 DIAGNOSIS — R143 Flatulence: Secondary | ICD-10-CM

## 2014-04-15 DIAGNOSIS — R1084 Generalized abdominal pain: Secondary | ICD-10-CM | POA: Insufficient documentation

## 2014-04-15 DIAGNOSIS — F419 Anxiety disorder, unspecified: Secondary | ICD-10-CM

## 2014-04-15 DIAGNOSIS — F411 Generalized anxiety disorder: Secondary | ICD-10-CM | POA: Insufficient documentation

## 2014-04-15 DIAGNOSIS — Z7982 Long term (current) use of aspirin: Secondary | ICD-10-CM | POA: Insufficient documentation

## 2014-04-15 DIAGNOSIS — R0602 Shortness of breath: Secondary | ICD-10-CM | POA: Insufficient documentation

## 2014-04-15 DIAGNOSIS — E7800 Pure hypercholesterolemia, unspecified: Secondary | ICD-10-CM | POA: Insufficient documentation

## 2014-04-15 DIAGNOSIS — Z90710 Acquired absence of both cervix and uterus: Secondary | ICD-10-CM | POA: Insufficient documentation

## 2014-04-15 DIAGNOSIS — Z9889 Other specified postprocedural states: Secondary | ICD-10-CM | POA: Insufficient documentation

## 2014-04-15 DIAGNOSIS — Z79899 Other long term (current) drug therapy: Secondary | ICD-10-CM | POA: Insufficient documentation

## 2014-04-15 DIAGNOSIS — R5383 Other fatigue: Secondary | ICD-10-CM

## 2014-04-15 DIAGNOSIS — R110 Nausea: Secondary | ICD-10-CM | POA: Insufficient documentation

## 2014-04-15 DIAGNOSIS — M549 Dorsalgia, unspecified: Secondary | ICD-10-CM | POA: Insufficient documentation

## 2014-04-15 DIAGNOSIS — F32A Depression, unspecified: Secondary | ICD-10-CM

## 2014-04-15 DIAGNOSIS — Z9089 Acquired absence of other organs: Secondary | ICD-10-CM | POA: Insufficient documentation

## 2014-04-15 DIAGNOSIS — M810 Age-related osteoporosis without current pathological fracture: Secondary | ICD-10-CM | POA: Insufficient documentation

## 2014-04-15 DIAGNOSIS — R142 Eructation: Secondary | ICD-10-CM

## 2014-04-15 DIAGNOSIS — E669 Obesity, unspecified: Secondary | ICD-10-CM | POA: Insufficient documentation

## 2014-04-15 DIAGNOSIS — F3289 Other specified depressive episodes: Secondary | ICD-10-CM | POA: Insufficient documentation

## 2014-04-15 DIAGNOSIS — M1990 Unspecified osteoarthritis, unspecified site: Secondary | ICD-10-CM | POA: Insufficient documentation

## 2014-04-15 DIAGNOSIS — R5381 Other malaise: Secondary | ICD-10-CM | POA: Insufficient documentation

## 2014-04-15 DIAGNOSIS — Z7983 Long term (current) use of bisphosphonates: Secondary | ICD-10-CM | POA: Insufficient documentation

## 2014-04-15 DIAGNOSIS — K219 Gastro-esophageal reflux disease without esophagitis: Secondary | ICD-10-CM | POA: Insufficient documentation

## 2014-04-15 DIAGNOSIS — Z9981 Dependence on supplemental oxygen: Secondary | ICD-10-CM | POA: Insufficient documentation

## 2014-04-15 LAB — CBC WITH DIFFERENTIAL/PLATELET
BASOS PCT: 0 % (ref 0–1)
Basophils Absolute: 0 10*3/uL (ref 0.0–0.1)
Eosinophils Absolute: 0.1 10*3/uL (ref 0.0–0.7)
Eosinophils Relative: 1 % (ref 0–5)
HEMATOCRIT: 44.2 % (ref 36.0–46.0)
HEMOGLOBIN: 15.3 g/dL — AB (ref 12.0–15.0)
LYMPHS ABS: 1.2 10*3/uL (ref 0.7–4.0)
Lymphocytes Relative: 15 % (ref 12–46)
MCH: 32.1 pg (ref 26.0–34.0)
MCHC: 34.6 g/dL (ref 30.0–36.0)
MCV: 92.9 fL (ref 78.0–100.0)
MONO ABS: 0.7 10*3/uL (ref 0.1–1.0)
MONOS PCT: 10 % (ref 3–12)
NEUTROS ABS: 5.7 10*3/uL (ref 1.7–7.7)
Neutrophils Relative %: 74 % (ref 43–77)
Platelets: 194 10*3/uL (ref 150–400)
RBC: 4.76 MIL/uL (ref 3.87–5.11)
RDW: 13.4 % (ref 11.5–15.5)
WBC: 7.7 10*3/uL (ref 4.0–10.5)

## 2014-04-15 LAB — URINALYSIS, ROUTINE W REFLEX MICROSCOPIC
Bilirubin Urine: NEGATIVE
GLUCOSE, UA: NEGATIVE mg/dL
Hgb urine dipstick: NEGATIVE
Ketones, ur: NEGATIVE mg/dL
Leukocytes, UA: NEGATIVE
NITRITE: NEGATIVE
PH: 6 (ref 5.0–8.0)
Protein, ur: NEGATIVE mg/dL
Specific Gravity, Urine: 1.022 (ref 1.005–1.030)
Urobilinogen, UA: 1 mg/dL (ref 0.0–1.0)

## 2014-04-15 LAB — COMPREHENSIVE METABOLIC PANEL
ALBUMIN: 4.2 g/dL (ref 3.5–5.2)
ALT: 44 U/L — ABNORMAL HIGH (ref 0–35)
AST: 34 U/L (ref 0–37)
Alkaline Phosphatase: 79 U/L (ref 39–117)
Anion gap: 13 (ref 5–15)
BILIRUBIN TOTAL: 0.6 mg/dL (ref 0.3–1.2)
BUN: 17 mg/dL (ref 6–23)
CHLORIDE: 102 meq/L (ref 96–112)
CO2: 26 mEq/L (ref 19–32)
Calcium: 9.8 mg/dL (ref 8.4–10.5)
Creatinine, Ser: 0.78 mg/dL (ref 0.50–1.10)
GFR calc Af Amer: >90 mL/min (ref >90)
GFR calc non Af Amer: 80 mL/min — ABNORMAL LOW (ref >90)
Glucose, Bld: 102 mg/dL — ABNORMAL HIGH (ref 70–99)
Potassium: 4.4 mEq/L (ref 3.7–5.3)
Sodium: 141 mEq/L (ref 137–147)
TOTAL PROTEIN: 7.2 g/dL (ref 6.0–8.3)

## 2014-04-15 LAB — LIPASE, BLOOD: Lipase: 65 U/L — ABNORMAL HIGH (ref 11–59)

## 2014-04-15 MED ORDER — LORAZEPAM 1 MG PO TABS
1.0000 mg | ORAL_TABLET | Freq: Once | ORAL | Status: AC
  Administered 2014-04-15: 1 mg via ORAL
  Filled 2014-04-15: qty 1

## 2014-04-15 NOTE — Discharge Instructions (Signed)
Abdominal Pain Many things can cause belly (abdominal) pain. Most times, the belly pain is not dangerous. Many cases of belly pain can be watched and treated at home. HOME CARE   Do not take medicines that help you go poop (laxatives) unless told to by your doctor.  Only take medicine as told by your doctor.  Eat or drink as told by your doctor. Your doctor will tell you if you should be on a special diet. GET HELP IF:  You do not know what is causing your belly pain.  You have belly pain while you are sick to your stomach (nauseous) or have runny poop (diarrhea).  You have pain while you pee or poop.  Your belly pain wakes you up at night.  You have belly pain that gets worse or better when you eat.  You have belly pain that gets worse when you eat fatty foods.  You have a fever. GET HELP RIGHT AWAY IF:   The pain does not go away within 2 hours.  You keep throwing up (vomiting).  The pain changes and is only in the right or left part of the belly.  You have bloody or tarry looking poop. MAKE SURE YOU:   Understand these instructions.  Will watch your condition.  Will get help right away if you are not doing well or get worse. Document Released: 03/04/2008 Document Revised: 09/21/2013 Document Reviewed: 05/26/2013 [facility] Patient Information 2015 [place], [place]. This information is not intended to replace advice given to you by your health care provider. Make sure you discuss any questions you have with your health care provider.  Depression, Adult Depression is feeling sad, low, down in the dumps, blue, gloomy, or empty. In general, there are two kinds of depression:  Normal sadness or grief. This can happen after something upsetting. It often goes away on its own within 2 weeks. After losing a loved one (bereavement), normal sadness and grief may last longer than two weeks. It usually gets better with time.  Clinical depression. This kind lasts longer than normal  sadness or grief. It keeps you from doing the things you normally do in life. It is often hard to function at home, work, or at school. It may affect your relationships with others. Treatment is often needed. GET HELP RIGHT AWAY IF:  You have thoughts about hurting yourself or others.  You lose touch with reality (psychotic symptoms). You may:  See or hear things that are not real.  Have untrue beliefs about your life or people around you.  Your medicine is giving you problems. MAKE SURE YOU:  Understand these instructions.  Will watch your condition.  Will get help right away if you are not doing well or get worse. Document Released: 10/19/2010 Document Revised: 06/10/2012 Document Reviewed: 01/16/2012 [facility] Patient Information 2015 [place], [place]. This information is not intended to replace advice given to you by your health care provider. Make sure you discuss any questions you have with your health care provider.

## 2014-04-15 NOTE — ED Notes (Signed)
She has multiple complaints. States " i just have not been feeling well. i have pain in my back and hips. im gassy. im nauseated. i feel shaky sometimes.im exhausted" She is A&Ox4, resp e/u

## 2014-04-15 NOTE — ED Notes (Signed)
Pt reports high stress level x 1 week, states daughter has moved to [place] and granddaughter will be going off to college. Pt states she has been taking xanax x 1 month with relief of symptoms. Pt states for approx 1 week she has had increased acid reflux, gas, abdominal discomfort with pressure feeling, and generalized weakness.

## 2014-04-15 NOTE — ED Provider Notes (Signed)
CSN: [id number]     Arrival date & time 04/15/14  1410 History   First MD Initiated Contact with Patient 04/15/14 1550     Chief Complaint  Patient presents with  . GI Problem     (Consider location/radiation/quality/duration/timing/severity/associated sxs/prior Treatment) HPI Pt is a 73yo obese female with hx of depression, anxiety, SOB, sleep apnea, and osteoporosis presenting to ED with vague complaints of "GI issues" and concern she has pancreatic cancer. Pt states for about 1 week she has had increased stress at home with her daughter moving to [facility] and her granddaughter going off to college. States she has taken her xanax for 1 month w/o relief of symptoms.  Within the last week she has had increased acid reflux, gas, and abdominal discomfort that is described as a pressure feeling around her lower ribcage and lower abdomen. Pt also reports generalized weakness. States her PCP believes her lower abdominal discomfort in his lower back and hips is due to arthritis.  Reports associated nausea but no fever, vomiting or diarrhea. Reports daily DMs w/o diarrhea or blood in stool.  Denies abdominal pain. Does report drinking up to 1 bottle of wine within 2 days but denies hx of pancreatitis. Denies family hx of pancreatic cancer. States she has noticed a 3lb weight loss "recently"  Denies fever or chills.  Denies urinary or vaginal symptoms.    Past Medical History  Diagnosis Date  . Sleep apnea     CPAP at 13cm H2o  . GERD (gastroesophageal reflux disease)   . Arthritis   . Depression   . Shortness of breath     on exertion  . Hypercholesteremia   . Depression   . Anxiety   . Sleep apnea   . Osteoarthrosis   . Obesity   . Osteoporosis    Past Surgical History  Procedure Laterality Date  . Cholecystectomy    . Abdominal hysterectomy      uterus and ovaries  . Plastic surgery on nose    . Colonoscopy with propofol N/A 03/23/2013    Procedure: COLONOSCOPY WITH PROPOFOL;   Surgeon: Garlan Fair, MD;  Location: [facility];  Service: Endoscopy;  Laterality: N/A;   Family History  Problem Relation Age of Onset  . Breast cancer Mother   . Lung cancer Sister   . Diabetes Father   . Congestive Heart Failure Father   . Heart attack Brother   . Hypertension Father   . Hypertension Brother   . Kidney disease Brother    History  Substance Use Topics  . Smoking status: Never Smoker   . Smokeless tobacco: Never Used  . Alcohol Use: No     Comment: daily wine until 3 months ago, patient decided to quit   OB History   Grav Para Term Preterm Abortions TAB SAB Ect Mult Living                 Review of Systems  Constitutional: Negative for fever, chills and fatigue.  Respiratory: Positive for shortness of breath. Negative for cough.   Cardiovascular: Positive for chest pain ( "pressure around my lower ribs"). Negative for palpitations and leg swelling.  Gastrointestinal: Positive for nausea and abdominal distention. Negative for vomiting, abdominal pain, diarrhea, constipation and blood in stool.  Genitourinary: Positive for flank pain ( "pressure around lower ribs"). Negative for dysuria, urgency, frequency, hematuria, decreased urine volume, vaginal discharge, vaginal pain and pelvic pain.  Musculoskeletal: Positive for arthralgias, back pain and myalgias. Negative  for joint swelling, neck pain and neck stiffness.  Skin: Negative for color change, rash and wound.  All other systems reviewed and are negative.     Allergies  Darvocet and Scallops  Home Medications   Prior to Admission medications   Medication Sig Start Date End Date Taking? Authorizing Provider  ALPRAZolam (XANAX) 0.25 MG tablet Take 0.25 mg by mouth daily as needed. For panic attacks 04/14/14  Yes Historical Provider, MD  aspirin 81 MG tablet Take 81 mg by mouth daily.   Yes Historical Provider, MD  busPIRone (BUSPAR) 15 MG tablet Take 15 mg by mouth 2 (two) times daily.   Yes  Historical Provider, MD  calcium carbonate (OS-CAL) 600 MG TABS Take 600 mg by mouth daily.   Yes Historical Provider, MD  Cholecalciferol (VITAMIN D PO) Take 1 tablet by mouth daily.    Yes Historical Provider, MD  citalopram (CELEXA) 20 MG tablet Take 20 mg by mouth every evening.   Yes Historical Provider, MD  ibandronate (BONIVA) 150 MG tablet Take 150 mg by mouth every 30 (thirty) days. Take in the morning with a full glass of water, on an empty stomach, and do not take anything else by mouth or lie down for the next 30 min.  Takes on Last day of month   Yes Historical Provider, MD  ibuprofen (ADVIL,MOTRIN) 200 MG tablet Take 400 mg by mouth every 6 (six) hours as needed for headache or moderate pain.    Yes Historical Provider, MD  omeprazole (PRILOSEC) 20 MG capsule Take 20 mg by mouth every evening.   Yes Historical Provider, MD  rosuvastatin (CRESTOR) 5 MG tablet Take 5 mg by mouth every evening.   Yes Historical Provider, MD   BP 136/90  Pulse 65  Temp(Src) 97.7 F (36.5 C) (Oral)  Resp 18  Ht 5\' 6"  (1.676 m)  Wt 197 lb 12.8 oz (89.721 kg)  BMI 31.94 kg/m2  SpO2 100% Physical Exam  Nursing note and vitals reviewed. Constitutional: She appears well-developed and well-nourished.  Elderly female lying in exam bed, appears concerned/mildly anxious.  HENT:  Head: Normocephalic and atraumatic.  Eyes: Conjunctivae are normal. No scleral icterus.  Neck: Normal range of motion.  Cardiovascular: Normal rate, regular rhythm and normal heart sounds.   Pulmonary/Chest: Effort normal and breath sounds normal. No respiratory distress. She has no wheezes. She has no rales. She exhibits no tenderness.  Abdominal: Soft. Bowel sounds are normal. She exhibits no distension and no mass. There is no tenderness. There is no rebound and no guarding.  Obese abdomen, soft, non-tender, no CVAT  Musculoskeletal: Normal range of motion. She exhibits no tenderness.  FROM all extremities, no midline spinal  tenderness.   Neurological: She is alert.  Normal gait  Skin: Skin is warm and dry.  Psychiatric: Her mood appears anxious. She exhibits a depressed mood. She expresses no homicidal and no suicidal ideation.    ED Course  Procedures (including critical care time) Labs Review Labs Reviewed  CBC WITH DIFFERENTIAL - Abnormal; Notable for the following:    Hemoglobin 15.3 (*)    All other components within normal limits  COMPREHENSIVE METABOLIC PANEL - Abnormal; Notable for the following:    Glucose, Bld 102 (*)    ALT 44 (*)    GFR calc non Af Amer 80 (*)    All other components within normal limits  URINALYSIS, ROUTINE W REFLEX MICROSCOPIC - Abnormal; Notable for the following:    APPearance HAZY (*)  All other components within normal limits  LIPASE, BLOOD - Abnormal; Notable for the following:    Lipase 65 (*)    All other components within normal limits    Imaging Review Dg Chest 2 View  04/15/2014   CLINICAL DATA:  73 year old female with chest pain and shortness of Breath. Initial encounter.  EXAM: CHEST  2 VIEW  COMPARISON:  10/10/2010 and earlier.  FINDINGS: Stable lung volumes, upper limits of normal. Normal cardiac size and mediastinal contours. Visualized tracheal air column is within normal limits. Chronic increased interstitial markings are stable. No pneumothorax, pulmonary edema, pleural effusion or confluent pulmonary opacity. No acute osseous abnormality identified.  IMPRESSION: No acute cardiopulmonary abnormality.   Electronically Signed   By: Lars Pinks M.D.   On: 04/15/2014 16:42     EKG Interpretation None      MDM   Final diagnoses:  Generalized abdominal discomfort  Anxiety and depression    Pt is a 73yo female reporting concern for pancreatic cancer presenting to ED with lower abdominal "discomfort" and "pressure around her lower rib cage" associated with nausea but denies pain. Pt has "high stress levels" at home and would like to speak with  behavioral health outpatient after she makes sure she is "physically okay."  Pt has denied multiple times she is not SI or HI.  On exam, pt appears anxious, and tearful at times.  No respiratory distress. Abd: obese but soft, non-tender. Pt denied urinary or vaginal symptoms.  Labs: CBC, CMP, UA: unremarkable. Lipase: mildly elevated 65.  Discussed minor elevation in lipase with pt and offered CT abd but stated it would not completely r/o pancreatic cancer as that is an evolving disease that needs to be monitored. However also explained pt is low risk but did encourage pt to decrease amount of alcohol she consumes.  Pt decided to f/u with per PCP for repeat labs and monitoring of her overall health including weight fluctuation and abdominal discomfort.  CXR: unremarkable.   Consulted with social work who recommended pt be seen at [facility] to help with her mental health related needs including medication such as pt's xanax.  Resource guide provided. Pt advised to f/u with [facility] on Monday, 7/20 during walk-in clinic 8am-5pm as well as call her PCP, Dr. Delfina Redwood, to schedule a f/u appointment. Pt was agreeable with this tx plan.  Return precautions provided. Pt verbalized understanding and agreement with tx plan.  Discussed pt with Dr. Alvino Chapel who agrees with tx plan.      Noland Fordyce, PA-C 04/15/14 1845

## 2014-04-15 NOTE — ED Notes (Addendum)
Pt stated "I feel like I'm going to die". This nurse questioned patient about statement, pt stated "I feel like i've just got a lot going on". Pt adamantly denies feeling SI/HI, states "I would never hurt myself, I have a daughter and I wouldn't do that to her ever". Pt tearful, stating she wants to follow up outpatient at [facility] with possible anxiety medication adjustment. PT offered TSS and other resources to get situation started, pt refused, states she will feel better knowing that physically she is ok. Junie Panning, [place] notified, at bedside.

## 2014-04-16 NOTE — ED Provider Notes (Signed)
Medical screening examination/treatment/procedure(s) were performed by non-physician practitioner and as supervising physician I was immediately available for consultation/collaboration.   EKG Interpretation None       Jasper Riling. Alvino Chapel, MD 04/16/14 1104

## 2014-06-08 ENCOUNTER — Other Ambulatory Visit: Payer: Self-pay | Admitting: Gastroenterology

## 2014-06-09 ENCOUNTER — Encounter (HOSPITAL_COMMUNITY): Payer: Self-pay | Admitting: Pharmacy Technician

## 2014-06-13 ENCOUNTER — Encounter (HOSPITAL_COMMUNITY): Payer: Self-pay | Admitting: *Deleted

## 2014-06-14 NOTE — Anesthesia Preprocedure Evaluation (Signed)
Anesthesia Evaluation  Patient identified by MRN, date of birth, ID band Patient awake    Reviewed: Allergy & Precautions, H&P , NPO status , Patient's Chart, lab work & pertinent test results  History of Anesthesia Complications Negative for: history of anesthetic complications  Airway Mallampati: II TM Distance: >3 FB Neck ROM: Full    Dental no notable dental hx.    Pulmonary sleep apnea and Continuous Positive Airway Pressure Ventilation ,  breath sounds clear to auscultation  Pulmonary exam normal       Cardiovascular negative cardio ROS  Rhythm:Regular Rate:Normal     Neuro/Psych Anxiety Depression negative neurological ROS     GI/Hepatic Neg liver ROS, GERD-  Medicated and Controlled,  Endo/Other  negative endocrine ROS  Renal/GU negative Renal ROS  negative genitourinary   Musculoskeletal  (+) Arthritis -, Osteoarthritis,    Abdominal   Peds negative pediatric ROS (+)  Hematology negative hematology ROS (+)   Anesthesia Other Findings   Reproductive/Obstetrics negative OB ROS                           Anesthesia Physical Anesthesia Plan  ASA: II  Anesthesia Plan: MAC   Post-op Pain Management:    Induction: Intravenous  Airway Management Planned: Nasal Cannula  Additional Equipment:   Intra-op Plan:   Post-operative Plan:   Informed Consent: I have reviewed the patients History and Physical, chart, labs and discussed the procedure including the risks, benefits and alternatives for the proposed anesthesia with the patient or authorized representative who has indicated his/her understanding and acceptance.   Dental advisory given  Plan Discussed with: CRNA  Anesthesia Plan Comments:         Anesthesia Quick Evaluation

## 2014-06-16 ENCOUNTER — Encounter (HOSPITAL_COMMUNITY)
Admission: RE | Disposition: A | Discharge status: Home / Self Care | Payer: Self-pay | Source: Ambulatory Visit | Attending: Gastroenterology

## 2014-06-16 ENCOUNTER — Ambulatory Visit (HOSPITAL_COMMUNITY)
Admission: RE | Admit: 2014-06-16 | Discharge: 2014-06-16 | Disposition: A | Discharge status: Home / Self Care | Payer: Medicare PPO | Source: Ambulatory Visit | Attending: Gastroenterology | Admitting: Gastroenterology

## 2014-06-16 ENCOUNTER — Ambulatory Visit (HOSPITAL_COMMUNITY): Payer: Medicare PPO | Admitting: Anesthesiology

## 2014-06-16 ENCOUNTER — Encounter (HOSPITAL_COMMUNITY): Payer: Self-pay

## 2014-06-16 ENCOUNTER — Encounter (HOSPITAL_COMMUNITY): Payer: Medicare PPO | Admitting: Anesthesiology

## 2014-06-16 DIAGNOSIS — G4733 Obstructive sleep apnea (adult) (pediatric): Secondary | ICD-10-CM | POA: Insufficient documentation

## 2014-06-16 DIAGNOSIS — K219 Gastro-esophageal reflux disease without esophagitis: Secondary | ICD-10-CM | POA: Insufficient documentation

## 2014-06-16 DIAGNOSIS — Z85828 Personal history of other malignant neoplasm of skin: Secondary | ICD-10-CM | POA: Insufficient documentation

## 2014-06-16 DIAGNOSIS — E7800 Pure hypercholesterolemia, unspecified: Secondary | ICD-10-CM | POA: Insufficient documentation

## 2014-06-16 DIAGNOSIS — Z885 Allergy status to narcotic agent status: Secondary | ICD-10-CM | POA: Insufficient documentation

## 2014-06-16 DIAGNOSIS — Z90710 Acquired absence of both cervix and uterus: Secondary | ICD-10-CM | POA: Insufficient documentation

## 2014-06-16 DIAGNOSIS — M810 Age-related osteoporosis without current pathological fracture: Secondary | ICD-10-CM | POA: Insufficient documentation

## 2014-06-16 DIAGNOSIS — Z7982 Long term (current) use of aspirin: Secondary | ICD-10-CM | POA: Diagnosis not present

## 2014-06-16 DIAGNOSIS — F3289 Other specified depressive episodes: Secondary | ICD-10-CM | POA: Insufficient documentation

## 2014-06-16 DIAGNOSIS — K3189 Other diseases of stomach and duodenum: Secondary | ICD-10-CM | POA: Diagnosis not present

## 2014-06-16 DIAGNOSIS — H918X9 Other specified hearing loss, unspecified ear: Secondary | ICD-10-CM | POA: Diagnosis not present

## 2014-06-16 DIAGNOSIS — R1013 Epigastric pain: Secondary | ICD-10-CM | POA: Diagnosis not present

## 2014-06-16 DIAGNOSIS — R195 Other fecal abnormalities: Secondary | ICD-10-CM | POA: Diagnosis present

## 2014-06-16 DIAGNOSIS — Z9089 Acquired absence of other organs: Secondary | ICD-10-CM | POA: Insufficient documentation

## 2014-06-16 DIAGNOSIS — F329 Major depressive disorder, single episode, unspecified: Secondary | ICD-10-CM | POA: Diagnosis not present

## 2014-06-16 HISTORY — DX: Unspecified malignant neoplasm of skin, unspecified: C44.90

## 2014-06-16 HISTORY — PX: ESOPHAGOGASTRODUODENOSCOPY (EGD) WITH PROPOFOL: SHX5813

## 2014-06-16 SURGERY — ESOPHAGOGASTRODUODENOSCOPY (EGD) WITH PROPOFOL
Anesthesia: Monitor Anesthesia Care

## 2014-06-16 MED ORDER — LACTATED RINGERS IV SOLN
INTRAVENOUS | Status: DC
  Administered 2014-06-16: 10:00:00 via INTRAVENOUS

## 2014-06-16 MED ORDER — BUTAMBEN-TETRACAINE-BENZOCAINE 2-2-14 % EX AERO
INHALATION_SPRAY | CUTANEOUS | Status: DC | PRN
  Administered 2014-06-16: 1 via TOPICAL

## 2014-06-16 MED ORDER — LIDOCAINE HCL (CARDIAC) 20 MG/ML IV SOLN
INTRAVENOUS | Status: DC | PRN
  Administered 2014-06-16: 50 mg via INTRAVENOUS

## 2014-06-16 MED ORDER — SODIUM CHLORIDE 0.9 % IV SOLN: INTRAVENOUS | Status: DC

## 2014-06-16 MED ORDER — PROPOFOL 10 MG/ML IV BOLUS
INTRAVENOUS | Status: DC | PRN
  Administered 2014-06-16 (×2): 50 mg via INTRAVENOUS

## 2014-06-16 MED ORDER — LIDOCAINE HCL (CARDIAC) 20 MG/ML IV SOLN
INTRAVENOUS | Status: AC
  Filled 2014-06-16: qty 5

## 2014-06-16 MED ORDER — PROPOFOL INFUSION 10 MG/ML OPTIME
INTRAVENOUS | Status: DC | PRN
  Administered 2014-06-16: 120 ug/kg/min via INTRAVENOUS

## 2014-06-16 MED ORDER — PROPOFOL 10 MG/ML IV BOLUS
INTRAVENOUS | Status: AC
  Filled 2014-06-16: qty 20

## 2014-06-16 SURGICAL SUPPLY — 14 items

## 2014-06-16 NOTE — H&P (Signed)
  Problem: Heme positive stool on aspirin therapy. Normal CBC performed 05/25/2013.  History: The patient is a 73 year old female who takes aspirin 81 mg daily plus Boniva daily. When she developed indigestion, she was prescribed proton pump inhibitor therapy. She underwent a normal screening colonoscopy on 03/23/2013.  The patient was told her stool was heme positive and her hemoglobin was normal.  The patient is scheduled to undergo diagnostic esophagogastroduodenoscopy to rule out peptic ulcer disease.  Past medical history: Hysterectomy. Oophorectomy. Tonsillectomy. Cholecystectomy. Nasal reconstruction surgery. Squamous cell  skin cancer excision. Depression. Hypercholesterolemia. Obstructive sleep apnea syndrome. Gastroesophageal reflux. Bilateral hearing loss. Osteoporosis.  Allergies: Darvocet  Exam: The patient is alert and lying comfortably on the endoscopy stretcher. Lungs are clear to auscultation. Cardiac exam reveals regular rhythm. Abdomen is soft and nontender to palpation  Plan: Proceed with diagnostic esophagogastroduodenoscopy

## 2014-06-16 NOTE — Anesthesia Postprocedure Evaluation (Signed)
  Anesthesia Post-op Note  Patient: Christie Harris  Procedure(s) Performed: Procedure(s) (LRB): ESOPHAGOGASTRODUODENOSCOPY (EGD) WITH PROPOFOL (N/A)  Patient Location: PACU  Anesthesia Type: MAC  Level of Consciousness: awake and alert   Airway and Oxygen Therapy: Patient Spontanous Breathing  Post-op Pain: mild  Post-op Assessment: Post-op Vital signs reviewed, Patient's Cardiovascular Status Stable, Respiratory Function Stable, Patent Airway and No signs of Nausea or vomiting  Last Vitals:  Filed Vitals:   06/16/14 1002  BP: 133/61  Pulse: 69  Temp: 36.8 C  Resp: 16    Post-op Vital Signs: stable   Complications: No apparent anesthesia complications

## 2014-06-16 NOTE — Transfer of Care (Signed)
Immediate Anesthesia Transfer of Care Note  Patient: Christie Harris  Procedure(s) Performed: Procedure(s): ESOPHAGOGASTRODUODENOSCOPY (EGD) WITH PROPOFOL (N/A)  Patient Location: PACU  Anesthesia Type:MAC  Level of Consciousness: awake, alert  and oriented  Airway & Oxygen Therapy: Patient Spontanous Breathing and Patient connected to nasal cannula oxygen  Post-op Assessment: Report given to PACU RN and Post -op Vital signs reviewed and stable  Post vital signs: Reviewed and stable  Complications: No apparent anesthesia complications

## 2014-06-16 NOTE — Discharge Instructions (Signed)
Gastrointestinal Endoscopy, Care After °Refer to this sheet in the next few weeks. These instructions provide you with information on caring for yourself after your procedure. Your caregiver may also give you more specific instructions. Your treatment has been planned according to current medical practices, but problems sometimes occur. Call your caregiver if you have any problems or questions after your procedure. °HOME CARE INSTRUCTIONS °· If you were given medicine to help you relax (sedative), do not drive, operate machinery, or sign important documents for 24 hours. °· Avoid alcohol and hot or warm beverages for the first 24 hours after the procedure. °· Only take over-the-counter or prescription medicines for pain, discomfort, or fever as directed by your caregiver. You may resume taking your normal medicines unless your caregiver tells you otherwise. Ask your caregiver when you may resume taking medicines that may cause bleeding, such as aspirin, clopidogrel, or warfarin. °· You may return to your normal diet and activities on the day after your procedure, or as directed by your caregiver. Walking may help to reduce any bloated feeling in your abdomen. °· Drink enough fluids to keep your urine clear or pale yellow. °· You may gargle with salt water if you have a sore throat. °SEEK IMMEDIATE MEDICAL CARE IF: °· You have severe nausea or vomiting. °· You have severe abdominal pain, abdominal cramps that last longer than 6 hours, or abdominal swelling (distention). °· You have severe shoulder or back pain. °· You have trouble swallowing. °· You have shortness of breath, your breathing is shallow, or you are breathing faster than normal. °· You have a fever or a rapid heartbeat. °· You vomit blood or material that looks like coffee grounds. °· You have bloody, black, or tarry stools. °MAKE SURE YOU: °· Understand these instructions. °· Will watch your condition. °· Will get help right away if you are not doing  well or get worse. °Document Released: 04/30/2004 Document Revised: 01/31/2014 Document Reviewed: 12/17/2011 °ExitCare® Patient Information ©2015 [employer]. This information is not intended to replace advice given to you by your health care provider. Make sure you discuss any questions you have with your health care provider. ° °

## 2014-06-16 NOTE — Op Note (Signed)
Problem: Heme positive stool on aspirin. Normal screening colonoscopy performed on 03/23/2013.  Endoscopist: Earle Gell  Premedication: Propofol administered by anesthesia  Procedure: Diagnostic esophagogastroduodenoscopy The patient was placed in the left lateral decubitus position. The Pentax gastroscope was passed through the posterior hypopharynx into the proximal esophagus without difficulty. The hypopharynx, larynx, and vocal cords appeared normal.  Esophagoscopy: The proximal, mid, and lower segments of the esophageal mucosa appeared normal. The squamocolumnar junction was regular in appearance and noted at 40 cm from the incisor teeth.  Gastroscopy: Retroflex view of the gastric cardia and fundus was normal. The gastric body, antrum, and pylorus appeared normal.  Duodenoscopy: The duodenal bulb and descending duodenum appeared normal.  Assessment: Normal esophagogastroduodenoscopy. No signs of upper gastrointestinal bleeding.  Recommendation: The patient's CBC was normal in August 2015. Her screening colonoscopy was normal in June 2014. Her esophagogastroduodenoscopy was normal today. I recommend no further evaluation.

## 2014-06-17 ENCOUNTER — Encounter (HOSPITAL_COMMUNITY): Payer: Self-pay | Admitting: Gastroenterology

## 2014-09-14 IMAGING — CR DG CHEST 2V
2 series · 2 of 2 positions shown · non-contrast
Comparison: 10/10/2010 and earlier.

CLINICAL DATA: 73-year-old female with chest pain and shortness of
Breath. Initial encounter.

EXAM:
CHEST  2 VIEW

[w chest pa]
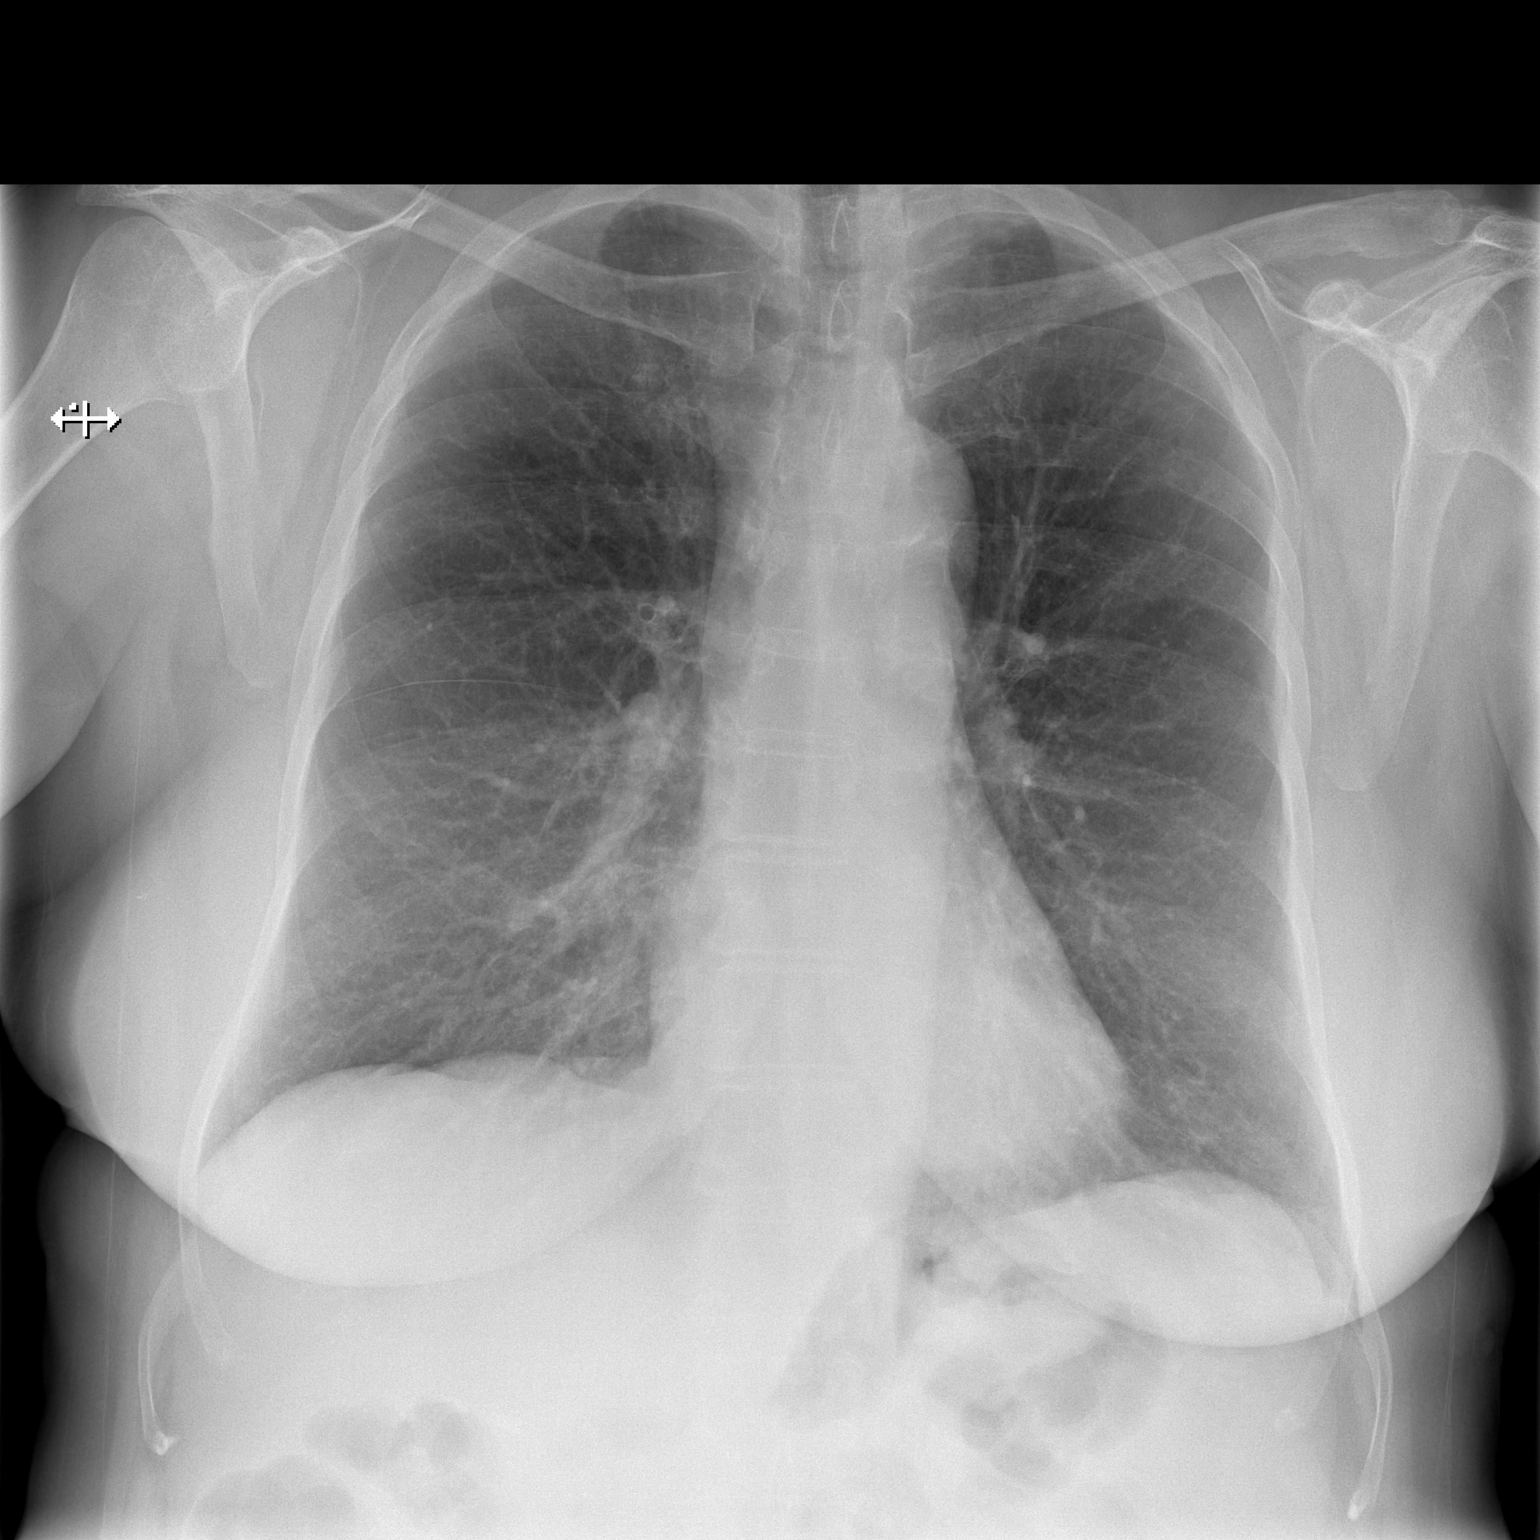

[w chest lat]
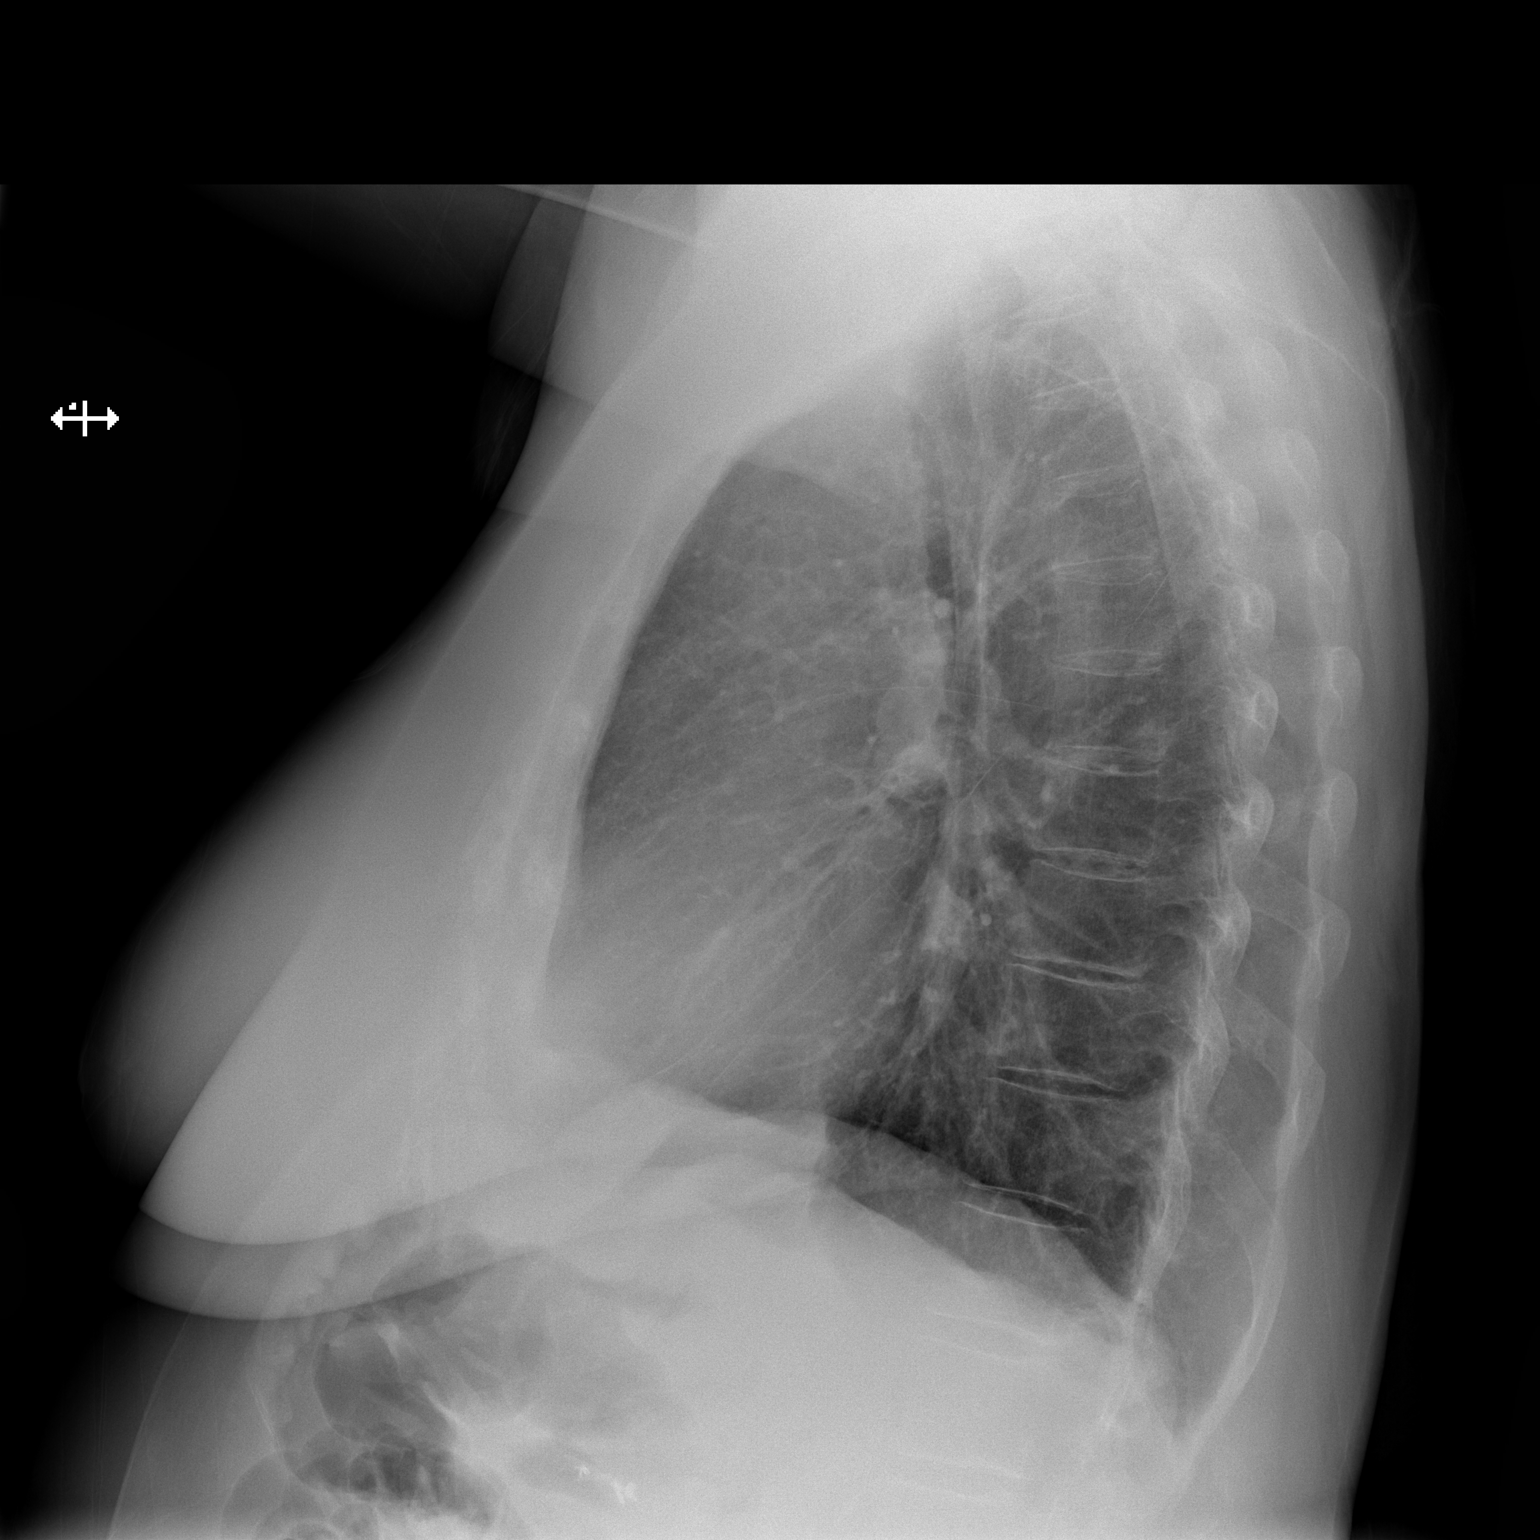

[2 of 2 positions shown; findings below may reference images not displayed]

FINDINGS: Stable lung volumes, upper limits of normal. Normal cardiac size and
mediastinal contours. Visualized tracheal air column is within
normal limits. Chronic increased interstitial markings are stable.
No pneumothorax, pulmonary edema, pleural effusion or confluent
pulmonary opacity. No acute osseous abnormality identified.
IMPRESSION: No acute cardiopulmonary abnormality.

## 2015-01-20 DIAGNOSIS — M899 Disorder of bone, unspecified: Secondary | ICD-10-CM | POA: Diagnosis not present

## 2015-02-02 ENCOUNTER — Other Ambulatory Visit: Payer: Self-pay

## 2015-04-14 DIAGNOSIS — Z1211 Encounter for screening for malignant neoplasm of colon: Secondary | ICD-10-CM | POA: Diagnosis not present

## 2015-06-19 DIAGNOSIS — Z1231 Encounter for screening mammogram for malignant neoplasm of breast: Secondary | ICD-10-CM | POA: Diagnosis not present

## 2015-07-07 DIAGNOSIS — H2512 Age-related nuclear cataract, left eye: Secondary | ICD-10-CM | POA: Diagnosis not present

## 2020-03-15 DIAGNOSIS — D485 Neoplasm of uncertain behavior of skin: Secondary | ICD-10-CM | POA: Diagnosis not present

## 2020-03-29 DIAGNOSIS — E119 Type 2 diabetes mellitus without complications: Secondary | ICD-10-CM | POA: Diagnosis not present

## 2020-05-04 DIAGNOSIS — R05 Cough: Secondary | ICD-10-CM | POA: Diagnosis not present

## 2020-05-04 DIAGNOSIS — R350 Frequency of micturition: Secondary | ICD-10-CM | POA: Diagnosis not present

## 2020-05-04 DIAGNOSIS — M791 Myalgia, unspecified site: Secondary | ICD-10-CM | POA: Diagnosis not present

## 2020-06-19 DIAGNOSIS — M8588 Other specified disorders of bone density and structure, other site: Secondary | ICD-10-CM | POA: Diagnosis not present

## 2020-06-19 DIAGNOSIS — Z1389 Encounter for screening for other disorder: Secondary | ICD-10-CM | POA: Diagnosis not present

## 2020-06-19 DIAGNOSIS — R42 Dizziness and giddiness: Secondary | ICD-10-CM | POA: Diagnosis not present

## 2020-06-19 DIAGNOSIS — H9190 Unspecified hearing loss, unspecified ear: Secondary | ICD-10-CM | POA: Diagnosis not present

## 2020-06-19 DIAGNOSIS — Z0000 Encounter for general adult medical examination without abnormal findings: Secondary | ICD-10-CM | POA: Diagnosis not present

## 2020-06-19 DIAGNOSIS — E1169 Type 2 diabetes mellitus with other specified complication: Secondary | ICD-10-CM | POA: Diagnosis not present

## 2020-06-19 DIAGNOSIS — E7800 Pure hypercholesterolemia, unspecified: Secondary | ICD-10-CM | POA: Diagnosis not present

## 2020-06-19 DIAGNOSIS — F324 Major depressive disorder, single episode, in partial remission: Secondary | ICD-10-CM | POA: Diagnosis not present

## 2020-06-23 DIAGNOSIS — Z20822 Contact with and (suspected) exposure to covid-19: Secondary | ICD-10-CM | POA: Diagnosis not present

## 2020-06-27 DIAGNOSIS — G4733 Obstructive sleep apnea (adult) (pediatric): Secondary | ICD-10-CM | POA: Diagnosis not present

## 2020-08-07 DIAGNOSIS — Z1231 Encounter for screening mammogram for malignant neoplasm of breast: Secondary | ICD-10-CM | POA: Diagnosis not present

## 2021-02-22 DIAGNOSIS — Z85828 Personal history of other malignant neoplasm of skin: Secondary | ICD-10-CM | POA: Diagnosis not present

## 2021-02-22 DIAGNOSIS — D2272 Melanocytic nevi of left lower limb, including hip: Secondary | ICD-10-CM | POA: Diagnosis not present

## 2021-02-22 DIAGNOSIS — D1722 Benign lipomatous neoplasm of skin and subcutaneous tissue of left arm: Secondary | ICD-10-CM | POA: Diagnosis not present

## 2021-02-22 DIAGNOSIS — L7211 Pilar cyst: Secondary | ICD-10-CM | POA: Diagnosis not present

## 2021-02-22 DIAGNOSIS — L720 Epidermal cyst: Secondary | ICD-10-CM | POA: Diagnosis not present

## 2021-02-22 DIAGNOSIS — D2262 Melanocytic nevi of left upper limb, including shoulder: Secondary | ICD-10-CM | POA: Diagnosis not present

## 2021-02-22 DIAGNOSIS — D225 Melanocytic nevi of trunk: Secondary | ICD-10-CM | POA: Diagnosis not present

## 2021-02-22 DIAGNOSIS — L821 Other seborrheic keratosis: Secondary | ICD-10-CM | POA: Diagnosis not present

## 2021-02-22 DIAGNOSIS — D2239 Melanocytic nevi of other parts of face: Secondary | ICD-10-CM | POA: Diagnosis not present

## 2021-02-28 DIAGNOSIS — L723 Sebaceous cyst: Secondary | ICD-10-CM | POA: Diagnosis not present

## 2021-03-06 DIAGNOSIS — R21 Rash and other nonspecific skin eruption: Secondary | ICD-10-CM | POA: Diagnosis not present

## 2021-03-06 DIAGNOSIS — R7309 Other abnormal glucose: Secondary | ICD-10-CM | POA: Diagnosis not present

## 2021-03-06 DIAGNOSIS — R5383 Other fatigue: Secondary | ICD-10-CM | POA: Diagnosis not present

## 2021-03-06 DIAGNOSIS — K219 Gastro-esophageal reflux disease without esophagitis: Secondary | ICD-10-CM | POA: Diagnosis not present

## 2021-03-06 DIAGNOSIS — M545 Low back pain, unspecified: Secondary | ICD-10-CM | POA: Diagnosis not present

## 2021-05-21 DIAGNOSIS — H04123 Dry eye syndrome of bilateral lacrimal glands: Secondary | ICD-10-CM | POA: Diagnosis not present

## 2021-05-21 DIAGNOSIS — E119 Type 2 diabetes mellitus without complications: Secondary | ICD-10-CM | POA: Diagnosis not present

## 2021-06-21 DIAGNOSIS — F3341 Major depressive disorder, recurrent, in partial remission: Secondary | ICD-10-CM | POA: Diagnosis not present

## 2021-06-21 DIAGNOSIS — Z0000 Encounter for general adult medical examination without abnormal findings: Secondary | ICD-10-CM | POA: Diagnosis not present

## 2021-06-21 DIAGNOSIS — R42 Dizziness and giddiness: Secondary | ICD-10-CM | POA: Diagnosis not present

## 2021-06-21 DIAGNOSIS — R350 Frequency of micturition: Secondary | ICD-10-CM | POA: Diagnosis not present

## 2021-06-21 DIAGNOSIS — E1169 Type 2 diabetes mellitus with other specified complication: Secondary | ICD-10-CM | POA: Diagnosis not present

## 2021-06-21 DIAGNOSIS — F324 Major depressive disorder, single episode, in partial remission: Secondary | ICD-10-CM | POA: Diagnosis not present

## 2021-06-21 DIAGNOSIS — E7800 Pure hypercholesterolemia, unspecified: Secondary | ICD-10-CM | POA: Diagnosis not present

## 2021-06-21 DIAGNOSIS — G4733 Obstructive sleep apnea (adult) (pediatric): Secondary | ICD-10-CM | POA: Diagnosis not present

## 2021-06-21 DIAGNOSIS — E663 Overweight: Secondary | ICD-10-CM | POA: Diagnosis not present

## 2021-08-13 DIAGNOSIS — Z1231 Encounter for screening mammogram for malignant neoplasm of breast: Secondary | ICD-10-CM | POA: Diagnosis not present

## 2021-09-12 DIAGNOSIS — Z85828 Personal history of other malignant neoplasm of skin: Secondary | ICD-10-CM | POA: Diagnosis not present

## 2021-09-12 DIAGNOSIS — B351 Tinea unguium: Secondary | ICD-10-CM | POA: Diagnosis not present

## 2021-09-12 DIAGNOSIS — L821 Other seborrheic keratosis: Secondary | ICD-10-CM | POA: Diagnosis not present

## 2021-09-12 DIAGNOSIS — I788 Other diseases of capillaries: Secondary | ICD-10-CM | POA: Diagnosis not present

## 2021-09-14 DIAGNOSIS — M545 Low back pain, unspecified: Secondary | ICD-10-CM | POA: Diagnosis not present

## 2021-09-14 DIAGNOSIS — R399 Unspecified symptoms and signs involving the genitourinary system: Secondary | ICD-10-CM | POA: Diagnosis not present

## 2021-09-30 DIAGNOSIS — B349 Viral infection, unspecified: Secondary | ICD-10-CM | POA: Diagnosis not present

## 2021-09-30 DIAGNOSIS — R058 Other specified cough: Secondary | ICD-10-CM | POA: Diagnosis not present

## 2021-09-30 DIAGNOSIS — Z20822 Contact with and (suspected) exposure to covid-19: Secondary | ICD-10-CM | POA: Diagnosis not present

## 2021-11-14 DIAGNOSIS — S299XXA Unspecified injury of thorax, initial encounter: Secondary | ICD-10-CM | POA: Diagnosis not present

## 2021-11-14 DIAGNOSIS — W010XXA Fall on same level from slipping, tripping and stumbling without subsequent striking against object, initial encounter: Secondary | ICD-10-CM | POA: Diagnosis not present

## 2021-12-20 DIAGNOSIS — G4730 Sleep apnea, unspecified: Secondary | ICD-10-CM | POA: Diagnosis not present

## 2021-12-20 DIAGNOSIS — E7800 Pure hypercholesterolemia, unspecified: Secondary | ICD-10-CM | POA: Diagnosis not present

## 2021-12-20 DIAGNOSIS — F3341 Major depressive disorder, recurrent, in partial remission: Secondary | ICD-10-CM | POA: Diagnosis not present

## 2021-12-20 DIAGNOSIS — E1169 Type 2 diabetes mellitus with other specified complication: Secondary | ICD-10-CM | POA: Diagnosis not present

## 2021-12-20 DIAGNOSIS — R42 Dizziness and giddiness: Secondary | ICD-10-CM | POA: Diagnosis not present

## 2022-02-19 DIAGNOSIS — D1801 Hemangioma of skin and subcutaneous tissue: Secondary | ICD-10-CM | POA: Diagnosis not present

## 2022-02-19 DIAGNOSIS — D2262 Melanocytic nevi of left upper limb, including shoulder: Secondary | ICD-10-CM | POA: Diagnosis not present

## 2022-02-19 DIAGNOSIS — L720 Epidermal cyst: Secondary | ICD-10-CM | POA: Diagnosis not present

## 2022-02-19 DIAGNOSIS — L814 Other melanin hyperpigmentation: Secondary | ICD-10-CM | POA: Diagnosis not present

## 2022-02-19 DIAGNOSIS — I788 Other diseases of capillaries: Secondary | ICD-10-CM | POA: Diagnosis not present

## 2022-02-19 DIAGNOSIS — D225 Melanocytic nevi of trunk: Secondary | ICD-10-CM | POA: Diagnosis not present

## 2022-02-19 DIAGNOSIS — L821 Other seborrheic keratosis: Secondary | ICD-10-CM | POA: Diagnosis not present

## 2022-02-19 DIAGNOSIS — B351 Tinea unguium: Secondary | ICD-10-CM | POA: Diagnosis not present

## 2022-02-19 DIAGNOSIS — Z85828 Personal history of other malignant neoplasm of skin: Secondary | ICD-10-CM | POA: Diagnosis not present

## 2022-03-14 DIAGNOSIS — R42 Dizziness and giddiness: Secondary | ICD-10-CM | POA: Diagnosis not present

## 2022-03-14 DIAGNOSIS — H539 Unspecified visual disturbance: Secondary | ICD-10-CM | POA: Diagnosis not present

## 2022-03-15 ENCOUNTER — Encounter: Payer: Self-pay | Admitting: Neurology

## 2022-03-29 ENCOUNTER — Other Ambulatory Visit: Payer: Self-pay | Admitting: Internal Medicine

## 2022-03-29 DIAGNOSIS — R42 Dizziness and giddiness: Secondary | ICD-10-CM

## 2022-03-29 DIAGNOSIS — H539 Unspecified visual disturbance: Secondary | ICD-10-CM

## 2022-04-29 ENCOUNTER — Ambulatory Visit
Admission: RE | Admit: 2022-04-29 | Discharge: 2022-04-29 | Disposition: A | Discharge status: Home / Self Care | Payer: Medicare PPO | Source: Ambulatory Visit | Attending: Internal Medicine | Admitting: Internal Medicine

## 2022-04-29 DIAGNOSIS — R42 Dizziness and giddiness: Secondary | ICD-10-CM

## 2022-04-29 DIAGNOSIS — H539 Unspecified visual disturbance: Secondary | ICD-10-CM

## 2022-04-29 DIAGNOSIS — H538 Other visual disturbances: Secondary | ICD-10-CM | POA: Diagnosis not present

## 2022-06-06 NOTE — Progress Notes (Unsigned)
Initial neurology clinic note  Christie Harris MRN: [id number] DOB: [birth year]  Referring provider: Seward Carol, MD  Primary care provider: Seward Carol, MD  Reason for consult:  Dizziness, trouble walking  Subjective:  This is [name], a 81 y.o. right-handed female with a medical history of DM2 c/b neuropathy, HLD, anxiety, depression, OSA (on CPAP), OA, hearing loss, GERD who presents to neurology clinic with multiple complaints as below. The patient is alone today.  Gait imbalance with falls x2 this weekend -This has been going on for about 1 year. -She describes walking, then staggering. She mentions that she walks funny. She feels like her feet are close to the ground and shuffling. She denies feeling stuck.  -She describes dizzy vs lightheaded. She feels like her head is not attached to her body. She does not think her body can do what she is telling it to do. She even finds it difficult to turn in bed. -For the first 2 hours every morning her hands shake and she is more likely to have imbalance than later in the day. She thinks the shaking is when she is moving and when staying still. She mentions reaching for her glasses in the morning and her hand shaking very badly when reaching for them. -She states that it is hard to get in cars and that she tends to walk behind everyone else (can't keep up). -She sleeps alone, but does not think she is acting out her dreams or having nightmares. She wakes up feeling rested.  Dizzy, hot, shaky, weak feeling episodes. -She describes acute attacks that come on suddenly -This has been going on around 10 years. She was standing at the time. It lasted a few minutes then resolved. It was very infrequent at that time (maybe yearly). -The episodes became more frequent over the last 2 years. Patient goes to the gym am then goes to [employer] directly afterwards. She eats before going to the gym, but does not drink water well. She  thinks she tends to be dehydrated per her account. She tends to have episodes while grocery shopping. -She can have episodes at home at well. She had an episode she remembers when she was picking up dishes. -The episodes still last a few minutes, but now occur about once per week.  -She does not know what helps, but it resolves on its own. She isn't sure that eating something helps or if sitting helps.   Pain in back of head -Patient has had this for about a decade. She describes it as a throbbing pain that is located on the back of her head and neck. It may be a little more on the right side. She has the pain daily. Leaning or laying on something makes it worse. Even a pillow causes discomfort. Her CPAP runs behind her head but she does not think things are better if she does not have it on her. She endorses that the CPAP strap is tight. She also mentions she needs new equipment. -She rates the pain as 7/10. It happens more at night but does not seem to be worse with position. -She previously followed with Dr Fransico Him for her CPAP (last seen in 2014). She does not think she has anyone following this currently.  Vision loss -Patient had cataract surgery on her left eye at the beginning of the [place] pandemic. She has felt her vision has been poor since. -1 year ago at her eye exam she was  told there was no problems with her eyes. -She has an appointment next week for her annual check up. She thinks she sees an optometrist. -She endorses double vision with one line of text angling off. Her vision is very blurry. It is constant and does not fluctuate, but there may be worse symptoms at night. -Closing one eye does not help.  Patient was seen by her PCP, Dr. Delfina Redwood on 03/14/22. She mentioned dizziness with change of position. Orthostatic vitals were negative. The dizziness then became more random and not associated with position change. She was off balance when walking. She also mentioned vision  changes and trouble reading. Per notes, patient saw an ophthalmologist who found no problems with her eyes. Patient was referred to neurology for further evaluation.  MEDICATIONS:  Outpatient Encounter Medications as of 06/12/2022  Medication Sig Note   busPIRone (BUSPAR) 15 MG tablet Take 15 mg by mouth 2 (two) times daily.    calcium carbonate (OS-CAL) 600 MG TABS Take 600 mg by mouth daily.    Cholecalciferol (VITAMIN D PO) Take 1 tablet by mouth every morning.     citalopram (CELEXA) 20 MG tablet Take 20 mg by mouth every evening.    omeprazole (PRILOSEC) 20 MG capsule Take 20 mg by mouth every evening.    rosuvastatin (CRESTOR) 5 MG tablet Take 5 mg by mouth every evening.    ALPRAZolam (XANAX) 0.25 MG tablet Take 0.25 mg by mouth daily as needed. For panic attacks (Patient not taking: Reported on 06/12/2022)    aspirin 81 MG tablet Take 81 mg by mouth at bedtime.  (Patient not taking: Reported on 06/12/2022) 06/09/2014: Has stopped for procedure.    ibandronate (BONIVA) 150 MG tablet Take 150 mg by mouth every 30 (thirty) days. Take in the morning with a full glass of water, on an empty stomach, and do not take anything else by mouth or lie down for the next 30 min.  Takes on Last day of month (Patient not taking: Reported on 06/12/2022) 06/09/2014: Around the first of the month.    No facility-administered encounter medications on file as of 06/12/2022.    PAST MEDICAL HISTORY: Past Medical History:  Diagnosis Date   Anxiety    Arthritis    greater left hip   CA of skin    squamous cell right leg-recent excision   Depression    Depression    GERD (gastroesophageal reflux disease)    Hypercholesteremia    Obesity    Osteoarthrosis    Osteoporosis    Shortness of breath    on exertion   Sleep apnea    CPAP at 13cm H2o   Sleep apnea     PAST SURGICAL HISTORY: Past Surgical History:  Procedure Laterality Date   ABDOMINAL HYSTERECTOMY     uterus and ovaries   CATARACT  EXTRACTION Right    CHOLECYSTECTOMY     COLONOSCOPY WITH PROPOFOL N/A 03/23/2013   Procedure: COLONOSCOPY WITH PROPOFOL;  Surgeon: Garlan Fair, MD;  Location: [facility];  Service: Endoscopy;  Laterality: N/A;colon polyps removed-benign   ESOPHAGOGASTRODUODENOSCOPY (EGD) WITH PROPOFOL N/A 06/16/2014   Procedure: ESOPHAGOGASTRODUODENOSCOPY (EGD) WITH PROPOFOL;  Surgeon: Garlan Fair, MD;  Location: [facility];  Service: Endoscopy;  Laterality: N/A;   plastic surgery on nose     "deviated septum and cosmetic"    ALLERGIES: Allergies  Allergen Reactions   Darvocet [Propoxyphene N-Acetaminophen] Other (See Comments)    "out of my mind"   Scallops [Shellfish  Allergy] Nausea And Vomiting    FAMILY HISTORY: Family History  Problem Relation Age of Onset   Breast cancer Mother    Diabetes Father    Congestive Heart Failure Father    Hypertension Father    Lung cancer Sister    Heart attack Brother    Kidney disease Brother    Hypertension Brother    Kidney disease Brother     SOCIAL HISTORY: Social History   Tobacco Use   Smoking status: Never   Smokeless tobacco: Never  Vaping Use   Vaping Use: Never used  Substance Use Topics   Alcohol use: Yes    Comment: wine weekly   Drug use: No   Social History   Social History Narrative   Right handed   Caffeine 1-2 mug in am 2 coke in pm    Live in a two story home with daughter    Objective:  Vital Signs:  Ht 5' 6.5" (1.689 m)   Wt 187 lb (84.8 kg)   SpO2 98%   BMI 29.73 kg/m  06/12/2022 1:10 PM 06/12/2022 1:11 PM 06/12/2022 1:12 PM   Orthostatic BP 160/84 122/72 160/82  BP Location Left Arm Left Arm Left Arm  Patient Position Supine Sitting Standing  Cuff Size Small Small Small  Orthostatic Pulse 76 88 85  SpO2 98 % 98 % 98 %    General: General appearance: Awake and alert. No distress. Cooperative with exam.  Skin: No obvious rash or jaundice. HEENT: ROM reduced in neck. Tight cervical paraspinal  muscles and trapezius Lungs: Non-labored breathing on room air  Psych: Affect appropriate.  Neurological: Mental Status: Alert. Speech fluent. No pseudobulbar affect Cranial Nerves: CNII: No RAPD. Visual fields intact. CNIII, IV, VI: PERRL. No nystagmus. EOMI. Some double vision with sustained up gaze. CN V: Facial sensation intact bilaterally to fine touch. CN VII: Facial muscles symmetric and strong (orbicularis oris and orbicularis oculi). No ptosis at rest or after sustained up gaze. CN VIII: Bilateral hearing loss. CN IX: No hypophonia. CN X: Palate elevates symmetrically. CN XI: Full strength shoulder shrug bilaterally. CN XII: Tongue protrusion full and midline. No atrophy or fasciculations. No significant dysarthria Motor: Tone: paratonia, difficulty relaxing No fasciculations in extremities. No atrophy.  Individual muscle group testing (MRC grade out of 5):  Movement     Neck flexion 5    Neck extension 5     Right Left   Shoulder abduction 5 5 No fatigable weakness  Shoulder adduction 5 5   Shoulder ext rotation 5 5   Shoulder int rotation 5 5   Elbow flexion 5 5   Elbow extension 5 5   Wrist extension 5 5   Wrist flexion 5 5   Finger abduction - FDI 5 5   Finger abduction - ADM 5 5   Finger extension 5 5   Finger distal flexion - 2/'3 5 5   '$ Finger distal flexion - 4/'5 5 5   '$ Thumb flexion - FPL 5 5   Thumb abduction - APB 5 5    Hip flexion 5 5   Hip extension 5 5   Hip adduction 5 5   Hip abduction 5 5   Knee extension 5 5   Knee flexion 5 5   Dorsiflexion 5 5   Plantarflexion 5 5   Inversion 5 5   Eversion 5 5   Great toe extension 5 5   Great toe flexion 5 5  Reflexes:  Right Left   Bicep 2+ 2+   Tricep 2+ 2+   BrRad 2+ 2+   Knee 2+ 2+   Ankle 2+ 2+    Pathological Reflexes: Babinski: flexor response bilaterally Hoffman: absent bilaterally Troemner: absent bilaterally  Sensation: Pinprick: 50% of normal in bilateral hands, 10% of  normal in bilateral feet Vibration: Intact in upper extremities. 3 seconds at bilateral great toes, 7 seconds at bilateral ankles Proprioception: Intact in bilateral great toes Coordination: Intact finger-to- nose-finger bilaterally. Romberg with mild sway Gait: Able to rise from chair with arms crossed unassisted. Narrow-based gait. Imbalance with tandem and when walking on toes or heels.   Labs and Imaging review: Out-side paper records, electronic medical record, and images have been reviewed where available and summarized as:  No results found for: "HGBA1C" No results found for: "VITAMINB12" No results found for: "TSH" No results found for: "ESRSEDRATE", "POCTSEDRATE"  External labs: Normal or unremarkable: CMP HbA1c (12/20/21): 6.7  CT head wo contrast (04/29/22): FINDINGS: Brain: No intracranial hemorrhage, mass effect, or evidence of acute infarct. No hydrocephalus. No extra-axial fluid collection. Mild cerebral volume loss. Ill-defined hypoattenuation within the cerebral white matter is nonspecific but consistent with chronic small vessel ischemic disease.   Vascular: No hyperdense vessel or unexpected calcification.   Skull: No fracture or focal lesion.   Sinuses/Orbits: No acute finding. Paranasal sinuses and mastoid air cells are well aerated.   Other: None.   IMPRESSION: No acute intracranial abnormality.   MRI cervical spine (12/23/2002): FINDINGS:  THERE IS REVERSAL OF THE NORMAL CERVICAL LORDOSIS CENTERED AT THE C5 LEVEL.  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  THE  PREVERTEBRAL SOFT TISSUES AND VISUALIZED PORTION OF THE POSTERIOR FOSSA ARE NORMAL.  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  C2-3:   NORMAL.  C3-4:   THERE IS A SMALL POSTERIOR DISK/OSTEOPHYTE COMPLEX WITH NO SUBSEQUENT  CENTRAL CANAL  STENOSIS OR NEURAL FORAMINAL NARROWING.  C4-5, C5-6, AND C6-7:   THERE ARE MODERATE SIZED POSTERIOR DISK/OSTEOPHYTE COMPLEXES WHICH EFFACE  THE VENTRAL SUBARACHNOID SPACE AND FLATTEN THE VENTRAL ASPECT OF THE CORD.  NO SIGNIFICANT NEURAL  FORAMINAL NARROWING IS IDENTIFIED.  THERE IS NO ABNORMAL CORD SIGNAL.  C7-T1:   NORMAL.   IMPRESSION  1.  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  2.  THERE ARE MODERATE SIZED POSTERIOR DISK/OSTEOPHYTE COMPLEXES AT C4-5, C5-6, AND C6-7 WHICH RESULT IN MILD CENTRAL CANAL NARROWING; HOWEVER, THERE IS NO ABNORMAL CORD SIGNAL AND NO SIGNIFICANT NEURAL FORAMINAL NARROWING BILATERALLY.    Assessment/Plan:  DREMA EDDINGTON is a 81 y.o. female who presents for evaluation of gait imbalance, dizzy/shaky episodes, neck and posterior head pain, and vision loss. She has a relevant medical history of DM2 c/b neuropathy, HLD, anxiety, depression, OSA (on CPAP), OA, hearing loss, GERD. Her neurological examination is pertinent for reduced range of motion and tightness in neck, diplopia with sustained up gaze, and diminished sensation in feet > hands. Her strength and reflexes are normal. Her vital signs were significant for a SBP drop of 38 when transitioning from laying to sitting. She also experienced dizziness and lightheadedness. Available diagnostic data is significant for HbA1c of 6.7.   Patient's symptoms are likely multifactorial. 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  This could contribute to imbalance and her autonomic symptoms (orthostatic hypotension/orthostatic intolerance). I will send labs for other treatable causes that could be contributing to neuropathy. Regarding her neck and head pain, this is consistent with cervicalgia. This could also contribute to balance. I will send a referral to PT for this. Another possible contributor to her head pain is her CPAP, which is very  tight per her report. She does not follow with sleep medicine and states she needs new equipment. I will refer her to sleep medicine today.  Finally, her vision problems are not clearly neurologic. She thinks they do not improve with covering one eye. Given that she thinks her double vision symptoms may fluctuate and be worse in the evening, I will send AChR ab to screen for myasthenia gravis. She has no other clear signs of MG though, so this is less likely. She is due for an eye exam, so ophtho evaluation is reasonable.  PLAN: -Blood work: B12, IFE, SPEP, TSH, AChR ab (binding, blocking, and modulating) -Physical therapy referral -Sleep medicine referral -Opthalmologist referral -Orthostatic hypotension/orthostatic intolerance instructions given -May consider EMG if other work up is unrevealing.   -Return to clinic in 3 months  The impression above as well as the plan as outlined below were extensively discussed with the patient who voiced understanding. All questions were answered to their satisfaction.  The patient was counseled on pertinent fall precautions per the printed material provided today, and as noted under the "Patient Instructions" section below.  When available, results of the above investigations and possible further recommendations will be communicated to the patient via telephone/MyChart. Patient to call office if not contacted after expected testing turnaround time.   Total time spent reviewing records, interview, history/exam, documentation, and coordination of care on day of encounter:  85 min   Thank you for allowing me to participate in patient's care.  If I can answer any additional questions, I would be pleased to do so.  Kai Levins, MD   CC: Seward Carol, MD [address]  [place] [id number]  CC: Referring provider: Seward Carol, MD [address] [place],  [place] [address]

## 2022-06-12 ENCOUNTER — Ambulatory Visit: Payer: Medicare PPO | Admitting: Neurology

## 2022-06-12 ENCOUNTER — Encounter: Payer: Self-pay | Admitting: Neurology

## 2022-06-12 ENCOUNTER — Other Ambulatory Visit (INDEPENDENT_AMBULATORY_CARE_PROVIDER_SITE_OTHER): Payer: Medicare PPO

## 2022-06-12 VITALS — Ht 66.5 in | Wt 187.0 lb

## 2022-06-12 DIAGNOSIS — H547 Unspecified visual loss: Secondary | ICD-10-CM

## 2022-06-12 DIAGNOSIS — R2681 Unsteadiness on feet: Secondary | ICD-10-CM

## 2022-06-12 DIAGNOSIS — E1142 Type 2 diabetes mellitus with diabetic polyneuropathy: Secondary | ICD-10-CM | POA: Diagnosis not present

## 2022-06-12 DIAGNOSIS — G4733 Obstructive sleep apnea (adult) (pediatric): Secondary | ICD-10-CM | POA: Diagnosis not present

## 2022-06-12 DIAGNOSIS — M542 Cervicalgia: Secondary | ICD-10-CM

## 2022-06-12 DIAGNOSIS — I951 Orthostatic hypotension: Secondary | ICD-10-CM

## 2022-06-12 NOTE — Patient Instructions (Addendum)
I saw you for multiple symptoms today.  -Walking problems, imbalance, numbness in feet.  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 Your very tight neck and reduced range of motion is also contributing. Your CPAP may also be too tight. I think you should talk to sleep medicine about this.  Your vision problems sound like they are eye related because they are present when you close one eye.   I would like to do the following: -Lab work today -Physical therapy referral - for neck pain and tightness -Sleep medicine referral - for CPAP equipment discussion -Opthalmologist referral - to test why your eyes are   Guidelines for the Nonpharmacological Treatment of Orthostatic Hypotension/ Orthostatic Tachycardia/ Orthostatic Intolerance  Make all postural changes from lying to sitting or sitting to standing, slowly.  Drink 2.0-2.5 L of fluid per day (if okay with your other doctors).  Increase sodium in the diet to 3-5 g per day (if okay with your other doctors).  Avoid large meals which can cause low blood pressure during digestion. It is better to eat smaller meals more often than 3 large meals.  Avoid alcohol. Alcohol can cause blood to pool in the legs which may worsen low blood pressure reactions when standing.  Perform lower extremity exercises to improve strength of the leg muscles. This will help prevent blood from pooling in the legs when standing and walking. Raise the head of the bed by 6-10 inches. The entire bed must be at an angle. Raising only the head portion of the bed at the waist level or using pillows will not be effective. Raising the head of the bed will reduce urine formation overnight and there will be more volume in the circulation in the morning. It may  also help orthostatic tolerance during the day.  During bad days or prior to engaging in more physical activity than usual, drink 500 ml of water quickly. This will result in increased blood pressure within 5 minutes of drinking the water. The effect will last up to a few hours and may improve orthostatic intolerance.  Use custom-fitted elastic support stockings. This will reduce a tendency for blood to pool in the legs when standing and may improve orthostatic intolerance. Abdominal binder or "SPANX" may also be useful.  Use physical counter-maneuvers such as leg crossing, squatting, or raising and resting the leg on a chair. These maneuvers increase blood pressure and can improve orthostatic intolerance.  Gently escalated aerobic physical activity program for graded reconditioning (see details below)  Home-based Exercise Plan for Patients with Orthostatic Intolerance (as may be seen in POTS and related disorders)  Level 1 - Reclined Gentle Movements This is the starting point for those patients most severely disabled by an autonomic disorder. If you are bedridden, this is your first step. We have known dysautonomia patients who were bedridden for years, and we able to work their way from "barely able to move" to biking 45 minutes a day, everyday. It will be hard. It may make you feel worse in the beginning. But the human body was meant to move. Just take baby steps until you can make it to your goal. Some patients can only do one minute a day, or one minute at a time, a few times a day. And they do this  every day for a week, and then they increase to two minutes a day the next week. This is a very slow process, but improving your health slowly is better than not improving it at all. Helpful exercises may include:  Leg Pillow Squeeze - while laying down or reclined in bed, put a pillow folded between your knees and squeeze. Hold it for 10 seconds. Repeat.  Arm Pillow Squeeze - put the pillow folded  between your palms and squeeze together as though you were putting your hands into a praying position. Hold it for 10 seconds. Repeat.  Alphabet Toes - while laying in bed, write your name in the air with your toes. If you can build up your strength, write the whole alphabet. Do this several times a day.  Side Leg Lifts - while laying on your side, lift your leg up sideways and then bring your leg back down, without touching your legs together. Repeat.  Front Leg Lifts - While laying on your back, life your left leg up, pointing your toe towards the ceiling. Repeat. Switch to right leg.  Gentle Stretching - any kind of stretching helps move blood around in the body and takes stress of your joints if you have been sitting or laying in the same position for a long time. Go through the entire body doing mild stretches, from feet, to legs, to back, to arms, to neck. Doing this when you wake up can be a great way to start the day, and repeating your stretches before bed can help you relax and sleep better.  Level 2 - Recumbent Cardio Exercises This is probably the level that most patients will be able to begin with, although everyone can benefit from the gentle stretching and toning exercises described in Level 1.  Always begin your workout with 5-10 minutes of stretching and/or yoga to warm up your muscles and protect your joints from injury.  Since the point of these exercises is to get your cardiovascular system to be more efficient, you will want to set a target heart rate for your workout. You should speak to your doctor about this because medications and other medical conditions can impact your target heart rate, but most patients can tolerate a workout at 75% to 80% of their maximum heart rate. [facility] has a Target Heart Rate Calculator you can use as a guide when speaking with your doctor.  You may want to purchase an exercise heart rate monitor to wear during your workouts to help you keep  your heart rate within your target zone. We do not endorse any specific products, but exercise heart rate monitors with chest straps are usually more accurate than pulse oximeters you place on your finger. This is especially so for dysautonomia patients who have abnormalities in peripheral blood flow (common in some forms of dysautonomia), as this is more likely to give an inaccurate reading using a finger based monitor.  Suggested reclined cardiovascular exercises include:  Rowing - use a rowing machine, or if you are feeling well enough, a kayak. You may want to start out slow, maybe 2-5 minutes a day. At your own pace, adding a few minutes per week, try to work your way up to 45 minutes per day, 5 days a week, with 30 minutes of your routine done in your target heart rate zone. Be sure to warm up at the beginning and cool down at the end.  Recumbent Biking - recumbent exercise bikes are different than regular  exercise bikes. They seat the rider in a reclined position, rather than upright. Try recumbent biking a few minutes a day, adding a few minutes each week, until you can work out 45 minutes a day, five days a week, with 30 minutes of that workout in your target heart rate zone. Be sure to warm up at the beginning and cool down at the end of each workout.  Swimming - The pressure from water helps prevent orthostatic symptoms. Dysautonomia patients who have been bedridden for years may be able to stand upright for an hour in a pool, because the pressure from the water prevents orthostatic symptoms from occurring, or lessens their impact. Dysautonomia patients can take advantage of this to get a good cardio workout, or to focus on stretching and strength training in the water. Always swim with a spotter or a buddy who can keep and eye on you, just in case you develop lightheadedness or other symptoms that would make it unsafe to be in a pool. It may be best to start your swimming exercise program at a  pool with a lifeguard, or with a Physical Therapist who specializes in aquatic therapy. A good old fashioned kick board can be a great tool for dysautonomia patients. You can kick your way around the pool, which gives you a good cardio workout, and all that kicking helps strengthen your legs. Toning up your legs and core is a great way to minimize orthostatic symptoms.  Weight Training - Most dysautonomia patients can benefit from overall increase in tone and strength. The stronger our muscles are, the more efficiently they use oxygen, the better we will be able to tolerate orthostatic stress. Special emphasis can be placed on strengthening leg and core muscles. Some patients find it useful to wear 2 to 4 lb. weights that attach to the ankles with velcro. This can really help with leg strength if you wear them often enough. There are also machines at the gym that assist with core and leg weight training. Begin with light weights, and use them in a reclined or seated position. Some patients become extra symptomatic when lifting their arms over their head, so take extra care if attempting to do that.  Level 3 - Normal Workouts Some dysautonomia patients are able to jog, run marathons or walk several miles a week. These patients should do whatever they can to continue these activities. Dysautonomia patients who are well-conditioned should exercise 45 minutes a day, at least 3 days per week. Special emphasis should be placed on leg and core strength, and cardiovascular exercises.   Preventing Falls at [facility] are common, often dreaded events in the lives of older people. Aside from the obvious injuries and even death that may result, fall can cause wide-ranging consequences including loss of independence, mental decline, decreased activity and mobility. Younger people are also at risk of falling, especially those with chronic illnesses and fatigue.  Ways to reduce risk for falling Examine diet and  medications. Warm foods and alcohol dilate blood vessels, which can lead to dizziness when standing. Sleep aids, antidepressants and pain medications can also increase the likelihood of a fall.  Get a vision exam. Poor vision, cataracts and glaucoma increase the chances of falling.  Check foot gear. Shoes should fit snugly and have a sturdy, nonskid sole and a broad, low heel  Participate in a physician-approved exercise program to build and maintain muscle strength and improve balance and coordination. Programs that use ankle weights or  stretch bands are excellent for muscle-strengthening. Water aerobics programs and low-impact Tai Chi programs have also been shown to improve balance and coordination.  Increase vitamin D intake. Vitamin D improves muscle strength and increases the amount of calcium the body is able to absorb and deposit in bones.  How to prevent falls from common hazards Floors - Remove all loose wires, cords, and throw rugs. Minimize clutter. Make sure rugs are anchored and smooth. Keep furniture in its usual place.  Chairs -- Use chairs with straight backs, armrests and firm seats. Add firm cushions to existing pieces to add height.  Bathroom - Install grab bars and non-skid tape in the tub or shower. Use a bathtub transfer bench or a shower chair with a back support Use an elevated toilet seat and/or safety rails to assist standing from a low surface. Do not use towel racks or bathroom tissue holders to help you stand.  Lighting - Make sure halls, stairways, and entrances are well-lit. Install a night light in your bathroom or hallway. Make sure there is a light switch at the top and bottom of the staircase. Turn lights on if you get up in the middle of the night. Make sure lamps or light switches are within reach of the bed if you have to get up during the night.  Kitchen - Install non-skid rubber mats near the sink and stove. Clean spills immediately. Store frequently used  utensils, pots, pans between waist and eye level. This helps prevent reaching and bending. Sit when getting things out of lower cupboards.  Living room/ Bedrooms - Place furniture with wide spaces in between, giving enough room to move around. Establish a route through the living room that gives you something to hold onto as you walk.  Stairs - Make sure treads, rails, and rugs are secure. Install a rail on both sides of the stairs. If stairs are a threat, it might be helpful to arrange most of your activities on the lower level to reduce the number of times you must climb the stairs.  Entrances and doorways - Install metal handles on the walls adjacent to the doorknobs of all doors to make it more secure as you travel through the doorway.  Tips for maintaining balance Keep at least one hand free at all times. Try using a backpack or fanny pack to hold things rather than carrying them in your hands. Never carry objects in both hands when walking as this interferes with keeping your balance.  Attempt to swing both arms from front to back while walking. This might require a conscious effort if Parkinson's disease has diminished your movement. It will, however, help you to maintain balance and posture, and reduce fatigue.  Consciously lift your feet off of the ground when walking. Shuffling and dragging of the feet is a common culprit in losing your balance.  When trying to navigate turns, use a "U" technique of facing forward and making a wide turn, rather than pivoting sharply.  Try to stand with your feet shoulder-length apart. When your feet are close together for any length of time, you increase your risk of losing your balance and falling.  Do one thing at a time. Don't try to walk and accomplish another task, such as reading or looking around. The decrease in your automatic reflexes complicates motor function, so the less distraction, the better.  Do not wear rubber or gripping soled shoes,  they might "catch" on the floor and cause tripping.  Move  slowly when changing positions. Use deliberate, concentrated movements and, if needed, use a grab bar or walking aid. Count 15 seconds between each movement. For example, when rising from a seated position, wait 15 seconds after standing to begin walking.  If balance is a continuous problem, you might want to consider a walking aid such as a cane, walking stick, or walker. Once you've mastered walking with help, you might be ready to try it on your own again.

## 2022-06-13 LAB — TSH: TSH: 1.26 u[IU]/mL (ref 0.35–5.50)

## 2022-06-13 LAB — VITAMIN B12: Vitamin B-12: 288 pg/mL (ref 211–911)

## 2022-06-16 LAB — IMMUNOFIXATION ELECTROPHORESIS
IgG (Immunoglobin G), Serum: 635 mg/dL (ref 600–1540)
Immunoglobulin A: 98 mg/dL (ref 70–320)

## 2022-06-16 LAB — PROTEIN ELECTROPHORESIS, SERUM
Albumin ELP: 4.2 g/dL (ref 3.8–4.8)
Alpha 1: 0.3 g/dL (ref 0.2–0.3)

## 2022-06-17 ENCOUNTER — Telehealth: Payer: Self-pay | Admitting: Neurology

## 2022-06-17 DIAGNOSIS — G4733 Obstructive sleep apnea (adult) (pediatric): Secondary | ICD-10-CM

## 2022-06-17 NOTE — Telephone Encounter (Signed)
The patient said dr.hill did not send a referral for sleep.  Immunizations say over due, she wants that's changed.  Lab results, they raised a lot of questions. She wants a call back to discuss.

## 2022-06-17 NOTE — Telephone Encounter (Signed)
Returned patient's call. She had many questions about testing and her results.  We discussed that her B12 level is low normal. I recommended she supplement B12 with 1000 mcg daily.   Regarding her sleep medicine referral, I had our staff look into it and make the correct referral to pulmonary for a sleep medicine specialist.  I explained that I would be in touch when I had the rest of her lab results.  All questions were answered.  Kai Levins, MD [facility]

## 2022-06-17 NOTE — Telephone Encounter (Signed)
Order placed for pulmonology for new cpap. Dr Berdine Addison stated he will call the pt

## 2022-06-19 LAB — MYASTHENIA GRAVIS PANEL 1
A CHR BINDING ABS: <0.3 nmol/L
STRIATED MUSCLE AB SCREEN: NEGATIVE

## 2022-06-20 ENCOUNTER — Encounter: Payer: Self-pay | Admitting: Neurology

## 2022-06-20 LAB — PROTEIN ELECTROPHORESIS, SERUM
Alpha 2: 0.8 g/dL (ref 0.5–0.9)
Beta 2: 0.3 g/dL (ref 0.2–0.5)
Beta Globulin: 0.4 g/dL (ref 0.4–0.6)
Gamma Globulin: 0.6 g/dL — ABNORMAL LOW (ref 0.8–1.7)
Total Protein: 6.5 g/dL (ref 6.1–8.1)

## 2022-06-20 LAB — IMMUNOFIXATION ELECTROPHORESIS
IgG (Immunoglobin G), Serum: 635 mg/dL (ref 600–1540)
IgM, Serum: 51 mg/dL (ref 50–300)

## 2022-06-25 DIAGNOSIS — H18593 Other hereditary corneal dystrophies, bilateral: Secondary | ICD-10-CM | POA: Diagnosis not present

## 2022-06-25 DIAGNOSIS — E119 Type 2 diabetes mellitus without complications: Secondary | ICD-10-CM | POA: Diagnosis not present

## 2022-06-26 NOTE — Therapy (Signed)
OUTPATIENT PHYSICAL THERAPY CERVICAL/BALANCE EVALUATION   Patient Name: Christie Harris MRN: [id number] DOB:[birth year], 81 y.o., female Today's Date: 06/27/2022   PT End of Session - 06/27/22 1150     Visit Number 1    Number of Visits 16    Date for PT Re-Evaluation 08/22/22    Authorization Type [employer]IC[employer] CHOICE PPO    PT Start Time 1150    PT Stop Time 1245    PT Time Calculation (min) 55 min    Activity Tolerance Patient tolerated treatment well;Patient limited by pain    Behavior During Therapy WFL for tasks assessed/performed             Past Medical History:  Diagnosis Date   Anxiety    Arthritis    greater left hip   CA of skin    squamous cell right leg-recent excision   Depression    Depression    GERD (gastroesophageal reflux disease)    Hypercholesteremia    Obesity    Osteoarthrosis    Osteoporosis    Shortness of breath    on exertion   Sleep apnea    CPAP at 13cm H2o   Sleep apnea    Past Surgical History:  Procedure Laterality Date   ABDOMINAL HYSTERECTOMY     uterus and ovaries   CATARACT EXTRACTION Right    CHOLECYSTECTOMY     COLONOSCOPY WITH PROPOFOL N/A 03/23/2013   Procedure: COLONOSCOPY WITH PROPOFOL;  Surgeon: Garlan Fair, MD;  Location: [facility];  Service: Endoscopy;  Laterality: N/A;colon polyps removed-benign   ESOPHAGOGASTRODUODENOSCOPY (EGD) WITH PROPOFOL N/A 06/16/2014   Procedure: ESOPHAGOGASTRODUODENOSCOPY (EGD) WITH PROPOFOL;  Surgeon: Garlan Fair, MD;  Location: [facility];  Service: Endoscopy;  Laterality: N/A;   plastic surgery on nose     "deviated septum and cosmetic"   Patient Active Problem List   Diagnosis Date Noted   OSA on CPAP 07/15/2013   Obesity (BMI 30-39.9) 07/15/2013   GERD (gastroesophageal reflux disease) 07/15/2013   Depression 07/15/2013    PCP: Seward Carol, MD  REFERRING PROVIDER: Shellia Carwin, MD  REFERRING DIAG:  M54.2 (ICD-10-CM) - Neck pain   E11.42 (ICD-10-CM) - Diabetic polyneuropathy associated with type 2 diabetes mellitus ([place])  R26.81 (ICD-10-CM) - Gait instability  I95.1 (ICD-10-CM) - Orthostatic hypotension  I95.1 (ICD-10-CM) - Orthostatic intolerance    THERAPY DIAG:  Cervicalgia  Muscle weakness (generalized)  Repeated falls  Abnormal posture  Rationale for Evaluation and Treatment Rehabilitation  ONSET DATE: neck pain  has been > 5 years,   balance problems 2 years or more  SUBJECTIVE:  SUBJECTIVE STATEMENT: I have sleep apnea and I am going to review with a specialist next week.  Dr Berdine Addison sent me hear for neck pain and balance issues. I get neck pain that goes up the back of my head more on the right than the Left.   It hurts so bad I cannot lie on my back  with even a soft pillow.  I cant  turn my neck as well to see while driving. I drive myself and I am a gym rat.  I like to work out to [employer] with Barnett Applebaum an Dede Query. I do cardio and strength and they work on balance.  I cannot sleep well at night.  I am afraid to get up from the floor. It took me a long time to get up last time I fell.   PERTINENT HISTORY:  HOH with hearing aids (very HOH), Anxiety/depression( well controlled, GERD,hypercholesteremia, obesity, OA, osteoporosis, sleep apnea with CPAP  PAIN:  Are you having pain? Yes: NPRS scale: 1/10 now at rest  but can at worst 9/10 Pain location: back of neck up into head R >L Pain description: thorbbing, more dull pain Aggravating factors: sleeping on back/lying down, turning head to drive or to even turn my head to watch TV and it throbs, any time I do not have support for my head, increases with tension Relieving factors: none  PRECAUTIONS: Fall  WEIGHT BEARING RESTRICTIONS No  FALLS:  Has  patient fallen in last 6 months? Yes. Number of falls 2 times  once when I was [place] and then the following day I fell at my granddaughters house getting out of a air mattress between wall and bed  and had no room and I fell.  I feel unsteady when I feel dizzy or lightheaded  LIVING ENVIRONMENT: Lives with: lives with their family and lives with their daughter Lives in: House/apartment Stairs: Yes: Internal: 14 steps; on left going up and External: 1 steps; none Has following equipment at home:  built in bench in shower Pt room is on the first floor and she avoids stairs OCCUPATION: retired  PLOF: Independent  PATIENT GOALS  Decrease neck pain,  decrease risk of falls, improve balance, drive with more comfort. Sleep more comfortably at night  OBJECTIVE:   DIAGNOSTIC FINDINGS:  IMPRESSION:04-29-22 No acute intracranial abnormality.  PATIENT SURVEYS:  FOTO 44%  55% predicted   COGNITION: Overall cognitive status: Within functional limits for tasks assessed   SENSATION: Pt reports tingling in bil  hands after waking up in the morning  POSTURE: rounded shoulders, forward head, and obesity  and mild thoracic kyphosis Occiput to wall  10 cm   10 cm at risk for falls PALPATION: TTP   CERVICAL ROM:   Active ROM A/PROM (deg) eval  Flexion 30  Extension 33  Right lateral flexion 20  Left lateral flexion 15  Right rotation 20 pain  Left rotation 23 pain   (Blank rows = not tested)  UPPER EXTREMITY ROM:  Active ROM Right eval Left eval  Shoulder flexion 136/ P149 140/ P149  Shoulder extension    Shoulder abduction 130 134  Shoulder adduction    Shoulder extension    Shoulder internal rotation T-12 t_12  Shoulder external rotation C-7 C-6  Elbow flexion    Elbow extension    Wrist flexion    Wrist extension    Wrist ulnar deviation    Wrist radial deviation  Wrist pronation    Wrist supination     (Blank rows = not  tested)  UPPER EXTREMITY MMT:  MMT Right eval Left eval  Shoulder flexion 5 5  Shoulder extension    Shoulder abduction 5 5  Shoulder adduction    Shoulder extension    Shoulder internal rotation 4 4  Shoulder external rotation 4 4  Middle trapezius 4- 4-  Lower trapezius 4- 4-  Elbow flexion    Elbow extension    Wrist flexion    Wrist extension    Wrist ulnar deviation    Wrist radial deviation    Wrist pronation    Wrist supination    Grip strength 46lb 54 lb   (Blank rows = not tested)  CERVICAL SPECIAL TESTS:  Neck flexor muscle endurance test: Positive, Spurling's test: Negative, and Distraction test: Negative  BP  142/82  pulse 82 FUNCTIONAL TESTS:  5 times sit to stand: 10.98 sec   13 sec or less no risk of fall 6 minute walk test: TBA Berg Balance Scale: 50/56 BERG BALANCE  Sitting to Standing: Numbers; 0-4: 4  4. Stands without using hands and stabilize independently  3. Stands independently using hands  2. Stands using hands after multiple trials  1. Min A to stand  0. Mod-Max A to stand Standing unsupported: Numbers; 0-4: 4  4. Stands safely for 2 minutes  3. Stands 2 minutes with supervision  2. Stands 30 seconds unsupported  1. Needs several tries to stand unsupported for 30 seconds  0. Unable to stand unsupported for 30 seconds Sitting unsupported: Numbers; 0-4: 4  4. Sits for 2 minutes independently  3. Sits for 2 minutes with supervision  2. Able to sit 30 seconds  1. Able to sit 10 seconds  0. Unable to sit for 10 seconds Standing to Sitting: Numbers; 0-4: 4 4. Sits safely with minimal use of hands 3. Controls descent with hands 2. Uses back of legs against chair to control descent 1. Sits independently, but uncontrolled descent 0. Needs assistance Transfers: Numbers; 0-4: 4  4. Transfers safely with minor use of hands  3. Transfers safely definite use of hands  2. Transfers with verbal cueing/supervision  1. Needs 1 person  assist  0. Needs 2 person assist  Standing with eyes closed: Numbers; 0-4: 4  4. Stands safely for 10 seconds  3. Stands 10 seconds with supervision   2. Able to stand for 3 seconds  1. Unable to keep eyes closed for 3 seconds, but is safe  0. Needs assist to keep from falling Standing with feet together: Numbers; 0-4: 4 4. Stands for 1 minute safely 3. Stands for 1 minute with supervision 2. Unable to hold for 30 seconds  1. Needs help to attain position but can hold for 15 seconds  0. Needs help to attain position and unable to hold for 15 seconds Reaching forward with outstretched arm: Numbers; 0-4: 4  4. Reaches forward 10 inches  3. Reaches forward 5 inches  2. Reaches forward 2 inches  1. Reaches forward with supervision  0. Loses balance/requires assistace Retrieving object from the floor: Numbers; 0-4: 4 4. Able to pick up easily and safely 3. Able to pick up with supervision 2. Unable to pick up, but reaches within 1-2 inches independently 1. Unable to pick up and needs supervision 0. Unable/needs assistance to keep from falling  Turning to look behind: Numbers; 0-4: 2  4. Looks behind from both sides and  weight shifts well  3. Looks behind one side only, other side less weight shift  2. Turns sideways only, maintains balance  1. Needs supervision when turning  0. Needs assistance  Turning 360 degrees: Numbers; 0-4: 4  4. Able to turn in </=4 seconds  3. Able to turn on one side in </= 4 seconds   2. Able to turn slowly, but safely  1. Needs supervision or verbal cueing  0. Needs assistance Place alternate foot on stool: Numbers; 0-4: 4 4. Completes 8 steps in 20 seconds 3. Completes 8 steps in >20 seconds 2. 4 steps without assistance/supervision 1. Completes >2 steps with minimal assist 0. Unable, needs assist to keep from falling Standing with one foot in front: Numbers; 0-4: 3  4. Independent tandem for 30 seconds  3. Independent foot ahead for 30  seconds  2. Independent small step for 30 seconds  1. Needs help to step, but can hold for 15 seconds  0. Loses balance while standing/stepping Standing on one foot: Numbers; 0-4: 1 4. Holds >10 seconds 3. Holds 5-10 seconds 2. Holds >/=3 seconds  1. Holds <3 seconds 0. Unable   R , 2 sec  L  < 3 sec Total Score: 50/56   TODAY'S TREATMENT:  06-27-22  EVAL Issue exercise HEP, initial explanation of orthostatic hypotension.  Fall prevention  EVAL   PATIENT EDUCATION:  Education details: POC, Explanation of findings , initial HEP issued  FOTO done Person educated: Patient Education method: Explanation, Demonstration, Tactile cues, Verbal cues, and Handouts Education comprehension: verbalized understanding, returned demonstration, verbal cues required, tactile cues required, and needs further education   HOME EXERCISE PROGRAM: Access Code: [email or web address] URL: [email or web address] Date: 06/27/2022 Prepared by: Voncille Lo  Exercises - Supine Deep Neck Flexor Training  - 2-3 x daily - 7 x weekly - 10 reps - 3 hold - Seated Cervical Retraction Protraction AROM  - 2-3 x daily - 7 x weekly - 1 sets - 10 reps - Seated Gentle Upper Trapezius Stretch  - 1 x daily - 7 x weekly - 1 sets - 3 reps - 30 hold - Gentle Levator Scapulae Stretch  - 1 x daily - 7 x weekly - 3 sets - 3 reps - 30 hold  ASSESSMENT:  CLINICAL IMPRESSION: Patient is a 81  y.o. female who was seen today for physical therapy evaluation and treatment for neck pain, balance issues and hx of 2 falls/ orthostatic hypotension.  Ms Stave is dedicated to working out at [employer] at least 3 x a week. But does have functional strength deficits and restricted neck AROM due to arthritic changes.  BERG balance 50/56 ,issue with SL balance and narrow base of support. Pt with occiput to wall measurement of 10 cm which is indicative of increase risk of falls as well as restricted neck motion that contributes to fall risk.   Pt will benefit from skilled PT to address impairments and increase better QOL.    OBJECTIVE IMPAIRMENTS decreased balance, decreased ROM, decreased strength, dizziness, and hypomobility.   ACTIVITY LIMITATIONS  turning head for driving and sleeping   PARTICIPATION LIMITATIONS: driving  [place] with hearing aids (very HOH), Anxiety/depression( well controlled, GERD,hypercholesteremia, obesity, OA, osteoporosis, sleep apnea with CPAP are also affecting patient's functional outcome.   REHAB POTENTIAL: Good  CLINICAL DECISION MAKING: Evolving/moderate complexity  EVALUATION COMPLEXITY: Moderate   GOALS: Goals reviewed with patient?  Partially will need to review 2nd visit  SHORT TERM GOALS: Target  date: 07/25/2022   Pt will be independent with initial HEP Baseline: limited knowledge Goal status: INITIAL  2.  Pt will be educated on sleeping postures and position to improve sleep quality at night for more restorative sleep Baseline: unable to sleep on back due to neck pain Goal status: INITIAL  3.  Demonstrate understanding of proper sitting posture, body mechanics, work ergonomics, and be more conscious of position and posture throughout the day.  Baseline: forward head posture moderate to severe  Goal status: INITIAL  4.  Report decreased pain by 25% in neck Baseline: 9/10 trying to turn neck Goal status: INITIAL   LONG TERM GOALS: Target date: 08/22/2022  Pt will be independent with  advanced HEP Baseline: limited knowledge Goal status: INITIAL  2.  Pt will be able to demonstrate getting up and down from the floor to decrease fear of falling Baseline: avoids falling Goal status: INITIAL  3.  Pt will improve BERG to at least 55/56 to show improved balance  Baseline: 50/56 BERG eval Goal status: INITIAL  4.  FOTO will improve from  44%  to   55%  indicating improved functional mobility  Baseline: 44% eval Goal status: INITIAL  5.  Improve UE MMT to  at least 4 to 4+/5 to improve posture and ability to visually scan environment Baseline: 10 cm occiput to head measure  10 cm at risk for fall with forward head Goal status: INITIAL    PLAN: PT FREQUENCY: 1-2x/week  PT DURATION: 8 weeks  PLANNED INTERVENTIONS: Therapeutic exercises, Therapeutic activity, Neuromuscular re-education, Balance training, Gait training, Patient/Family education, Self Care, Joint mobilization, Dry Needling, Electrical stimulation, Spinal mobilization, Cryotherapy, Moist heat, Taping, Ionotophoresis '4mg'$ /ml Dexamethasone, Manual therapy, and Re-evaluationVestibular as needed.  PLAN FOR NEXT SESSION: Go over FOTO report, review HEP and manual and perhaps TPDN.   Orthostatic Hypotension explanation and fall prevention. Review goals    Referring diagnosis?  M54.2 (ICD-10-CM) - Neck pain  E11.42 (ICD-10-CM) - Diabetic polyneuropathy associated with type 2 diabetes mellitus ([facility])  R26.81 (ICD-10-CM) - Gait instability  I95.1 (ICD-10-CM) - Orthostatic hypotension  I95.1 (ICD-10-CM) - Orthostatic intolerance   Treatment diagnosis? (if different than referring diagnosis) abnormal posture (kyphosis) What was this (referring dx) caused by? '[]'$  Surgery '[x]'$  Fall '[]'$  Ongoing issue '[x]'$  Arthritis '[x]'$  Other: _neck pain___________  Laterality: '[]'$  Rt '[]'$  Lt '[x]'$  Both  Check all possible CPT codes:  *CHOOSE 10 OR LESS*    '[x]'$  97110 (Therapeutic Exercise)  '[]'$  92507 (SLP Treatment)  '[x]'$  97112 (Neuro Re-ed)   '[]'$  92526 (Swallowing Treatment)   '[x]'$  97116 (Gait Training)   '[]'$  [id number] (Cognitive Training, 1st 15 minutes) '[x]'$  97140 (Manual Therapy)   '[]'$  97130 (Cognitive Training, each add'l 15 minutes)  '[x]'$  97164 (Re-evaluation)                              '[]'$  Other, List CPT Code ____________  '[x]'$  97530 (Therapeutic Activities)     '[x]'$  97535 (Self Care)   '[x]'$  All codes above (97110 - 97535)  '[]'$  97012 (Mechanical Traction)  '[x]'$  97014 (E-stim Unattended)  '[x]'$  97032 (E-stim  manual)  '[x]'$  97033 (Ionto)  '[]'$  97035 (Ultrasound) '[x]'$  96045 (Physical Performance Training) '[]'$  [id number] (Aquatic Therapy) '[]'$  97016 (Vasopneumatic Device) '[]'$  [id number] (Paraffin) '[]'$  97034 (Contrast Bath) '[]'$  97597 (Wound Care 1st 20 sq cm) '[]'$  97598 (Wound Care each add'l 20 sq cm) '[]'$  97760 (Orthotic Fabrication, Fitting, Training Initial) '[]'$  [id number] (  Prosthetic Management and Training Initial) '[]'$  [phone] (Orthotic or Prosthetic Training/ Modification Subsequent)   Voncille Lo, PT, [place] Certified Exercise Expert for the Aging Adult  06/27/22 1:28 PM Phone: [phone] Fax: [phone]

## 2022-06-27 ENCOUNTER — Ambulatory Visit: Payer: Medicare PPO | Attending: Neurology | Admitting: Physical Therapy

## 2022-06-27 ENCOUNTER — Encounter: Payer: Self-pay | Admitting: Physical Therapy

## 2022-06-27 DIAGNOSIS — E1142 Type 2 diabetes mellitus with diabetic polyneuropathy: Secondary | ICD-10-CM | POA: Diagnosis not present

## 2022-06-27 DIAGNOSIS — M6281 Muscle weakness (generalized): Secondary | ICD-10-CM | POA: Diagnosis not present

## 2022-06-27 DIAGNOSIS — R2681 Unsteadiness on feet: Secondary | ICD-10-CM | POA: Diagnosis not present

## 2022-06-27 DIAGNOSIS — M542 Cervicalgia: Secondary | ICD-10-CM | POA: Insufficient documentation

## 2022-06-27 DIAGNOSIS — R296 Repeated falls: Secondary | ICD-10-CM | POA: Diagnosis not present

## 2022-06-27 DIAGNOSIS — R293 Abnormal posture: Secondary | ICD-10-CM | POA: Diagnosis not present

## 2022-06-27 DIAGNOSIS — I951 Orthostatic hypotension: Secondary | ICD-10-CM | POA: Diagnosis not present

## 2022-06-27 NOTE — Patient Instructions (Signed)
Access Code: [email or web address] URL: [email or web address] Date: 06/27/2022 Prepared by: Voncille Lo  Exercises - Supine Deep Neck Flexor Training  - 2-3 x daily - 7 x weekly - 10 reps - 3 hold - Seated Cervical Retraction Protraction AROM  - 2-3 x daily - 7 x weekly - 1 sets - 10 reps - Seated Gentle Upper Trapezius Stretch  - 1 x daily - 7 x weekly - 1 sets - 3 reps - 30 hold - Gentle Levator Scapulae Stretch  - 1 x daily - 7 x weekly - 3 sets - 3 reps - 30 hold  Voncille Lo, PT, [facility] Certified Exercise Expert for the Aging Adult  06/27/22 12:39 PM Phone: [phone] Fax: [phone]

## 2022-06-28 ENCOUNTER — Telehealth: Payer: Self-pay | Admitting: Neurology

## 2022-06-28 NOTE — Telephone Encounter (Signed)
Pt said she has not received a referral for  ophthalmology. Has not heard from anyone

## 2022-06-28 NOTE — Telephone Encounter (Signed)
[place] they wanted me to refax the referral. Fax [phone]. Called Pt and informed her of this and she was good with it.

## 2022-07-02 ENCOUNTER — Ambulatory Visit: Payer: Medicare PPO | Attending: Neurology | Admitting: Physical Therapy

## 2022-07-02 DIAGNOSIS — R293 Abnormal posture: Secondary | ICD-10-CM | POA: Insufficient documentation

## 2022-07-02 DIAGNOSIS — M6281 Muscle weakness (generalized): Secondary | ICD-10-CM | POA: Diagnosis not present

## 2022-07-02 DIAGNOSIS — R296 Repeated falls: Secondary | ICD-10-CM | POA: Diagnosis not present

## 2022-07-02 DIAGNOSIS — M542 Cervicalgia: Secondary | ICD-10-CM | POA: Diagnosis not present

## 2022-07-02 NOTE — Therapy (Signed)
OUTPATIENT PHYSICAL THERAPY CERVICAL/BALANCE EVALUATION   Patient Name: Christie Harris MRN: [id number] DOB:August 31, 1941, 81 y.o., female Today's Date: 07/02/2022   PT End of Session - 07/02/22 1503     Visit Number 2    Number of Visits 16    Date for PT Re-Evaluation 08/22/22    Authorization Type [employer] [place] MEDICARE CHOICE PPO    PT Start Time 1503    PT Stop Time 1551    PT Time Calculation (min) 48 min    Activity Tolerance Patient tolerated treatment well;Patient limited by pain    Behavior During Therapy WFL for tasks assessed/performed              Past Medical History:  Diagnosis Date   Anxiety    Arthritis    greater left hip   CA of skin    squamous cell right leg-recent excision   Depression    Depression    GERD (gastroesophageal reflux disease)    Hypercholesteremia    Obesity    Osteoarthrosis    Osteoporosis    Shortness of breath    on exertion   Sleep apnea    CPAP at 13cm H2o   Sleep apnea    Past Surgical History:  Procedure Laterality Date   ABDOMINAL HYSTERECTOMY     uterus and ovaries   CATARACT EXTRACTION Right    CHOLECYSTECTOMY     COLONOSCOPY WITH PROPOFOL N/A 03/23/2013   Procedure: COLONOSCOPY WITH PROPOFOL;  Surgeon: Garlan Fair, MD;  Location: [facility];  Service: Endoscopy;  Laterality: N/A;colon polyps removed-benign   ESOPHAGOGASTRODUODENOSCOPY (EGD) WITH PROPOFOL N/A 06/16/2014   Procedure: ESOPHAGOGASTRODUODENOSCOPY (EGD) WITH PROPOFOL;  Surgeon: Garlan Fair, MD;  Location: [facility];  Service: Endoscopy;  Laterality: N/A;   plastic surgery on nose     "deviated septum and cosmetic"   Patient Active Problem List   Diagnosis Date Noted   OSA on CPAP 07/15/2013   Obesity (BMI 30-39.9) 07/15/2013   GERD (gastroesophageal reflux disease) 07/15/2013   Depression 07/15/2013    PCP: Seward Carol, MD  REFERRING PROVIDER: Shellia Carwin, MD  REFERRING DIAG:  M54.2 (ICD-10-CM) - Neck pain   E11.42 (ICD-10-CM) - Diabetic polyneuropathy associated with type 2 diabetes mellitus ([place])  R26.81 (ICD-10-CM) - Gait instability  I95.1 (ICD-10-CM) - Orthostatic hypotension  I95.1 (ICD-10-CM) - Orthostatic intolerance    THERAPY DIAG:  Cervicalgia  Muscle weakness (generalized)  Repeated falls  Abnormal posture  Rationale for Evaluation and Treatment Rehabilitation  ONSET DATE: neck pain  has been > 5 years,   balance problems 2 years or more  SUBJECTIVE:  SUBJECTIVE STATEMENT: "Doing pretty good, I am having no pain today. I only had alittle bit when I went to bed last night."  PERTINENT HISTORY:  HOH with hearing aids (very HOH), Anxiety/depression( well controlled, GERD,hypercholesteremia, obesity, OA, osteoporosis, sleep apnea with CPAP  PAIN:  Are you having pain? Yes: NPRS scale: 0/10 now at rest  but can at worst 9/10 Pain location: back of neck up into head R >L Pain description: thorbbing, more dull pain Aggravating factors: sleeping on back/lying down, turning head to drive or to even turn my head to watch TV and it throbs, any time I do not have support for my head, increases with tension Relieving factors: none  PRECAUTIONS: Fall  WEIGHT BEARING RESTRICTIONS No  FALLS:  Has patient fallen in last 6 months? Yes. Number of falls 2 times  once when I was tripping [place] and then the following day I fell at my granddaughters house getting out of a air mattress between wall and bed  and had no room and I fell.  I feel unsteady when I feel dizzy or lightheaded  LIVING ENVIRONMENT: Lives with: lives with their family and lives with their daughter Lives in: House/apartment Stairs: Yes: Internal: 14 steps; on left going up and External: 1 steps;  none Has following equipment at home:  built in bench in shower Pt room is on the first floor and she avoids stairs OCCUPATION: retired  PLOF: Independent  PATIENT GOALS  Decrease neck pain,  decrease risk of falls, improve balance, drive with more comfort. Sleep more comfortably at night  OBJECTIVE:   DIAGNOSTIC FINDINGS:  IMPRESSION:04-29-22 No acute intracranial abnormality.  PATIENT SURVEYS:  FOTO 44%  55% predicted   COGNITION: Overall cognitive status: Within functional limits for tasks assessed   SENSATION: Pt reports tingling in bil  hands after waking up in the morning  POSTURE: rounded shoulders, forward head, and obesity  and mild thoracic kyphosis Occiput to wall  10 cm   10 cm at risk for falls PALPATION: TTP   CERVICAL ROM:   Active ROM A/PROM (deg) eval  Flexion 30  Extension 33  Right lateral flexion 20  Left lateral flexion 15  Right rotation 20 pain  Left rotation 23 pain   (Blank rows = not tested)  UPPER EXTREMITY ROM:  Active ROM Right eval Left eval  Shoulder flexion 136/ P149 140/ P149  Shoulder extension    Shoulder abduction 130 134  Shoulder adduction    Shoulder extension    Shoulder internal rotation T-12 t_12  Shoulder external rotation C-7 C-6  Elbow flexion    Elbow extension    Wrist flexion    Wrist extension    Wrist ulnar deviation    Wrist radial deviation    Wrist pronation    Wrist supination     (Blank rows = not tested)  UPPER EXTREMITY MMT:  MMT Right eval Left eval  Shoulder flexion 5 5  Shoulder extension    Shoulder abduction 5 5  Shoulder adduction    Shoulder extension    Shoulder internal rotation 4 4  Shoulder external rotation 4 4  Middle trapezius 4- 4-  Lower trapezius 4- 4-  Elbow flexion    Elbow extension    Wrist flexion    Wrist extension    Wrist ulnar deviation    Wrist radial deviation    Wrist pronation    Wrist supination    Grip strength 46lb  54 lb   (Blank rows = not  tested)  CERVICAL SPECIAL TESTS:  Neck flexor muscle endurance test: Positive, Spurling's test: Negative, and Distraction test: Negative  BP  142/82  pulse 82 FUNCTIONAL TESTS:  5 times sit to stand: 10.98 sec   13 sec or less no risk of fall 6 minute walk test: TBA Berg Balance Scale: 50/56 BERG BALANCE  Sitting to Standing: Numbers; 0-4: 4  4. Stands without using hands and stabilize independently  3. Stands independently using hands  2. Stands using hands after multiple trials  1. Min A to stand  0. Mod-Max A to stand Standing unsupported: Numbers; 0-4: 4  4. Stands safely for 2 minutes  3. Stands 2 minutes with supervision  2. Stands 30 seconds unsupported  1. Needs several tries to stand unsupported for 30 seconds  0. Unable to stand unsupported for 30 seconds Sitting unsupported: Numbers; 0-4: 4  4. Sits for 2 minutes independently  3. Sits for 2 minutes with supervision  2. Able to sit 30 seconds  1. Able to sit 10 seconds  0. Unable to sit for 10 seconds Standing to Sitting: Numbers; 0-4: 4 4. Sits safely with minimal use of hands 3. Controls descent with hands 2. Uses back of legs against chair to control descent 1. Sits independently, but uncontrolled descent 0. Needs assistance Transfers: Numbers; 0-4: 4  4. Transfers safely with minor use of hands  3. Transfers safely definite use of hands  2. Transfers with verbal cueing/supervision  1. Needs 1 person assist  0. Needs 2 person assist  Standing with eyes closed: Numbers; 0-4: 4  4. Stands safely for 10 seconds  3. Stands 10 seconds with supervision   2. Able to stand for 3 seconds  1. Unable to keep eyes closed for 3 seconds, but is safe  0. Needs assist to keep from falling Standing with feet together: Numbers; 0-4: 4 4. Stands for 1 minute safely 3. Stands for 1 minute with supervision 2. Unable to hold for 30 seconds  1. Needs help to attain position but can hold for 15 seconds  0. Needs help to  attain position and unable to hold for 15 seconds Reaching forward with outstretched arm: Numbers; 0-4: 4  4. Reaches forward 10 inches  3. Reaches forward 5 inches  2. Reaches forward 2 inches  1. Reaches forward with supervision  0. Loses balance/requires assistace Retrieving object from the floor: Numbers; 0-4: 4 4. Able to pick up easily and safely 3. Able to pick up with supervision 2. Unable to pick up, but reaches within 1-2 inches independently 1. Unable to pick up and needs supervision 0. Unable/needs assistance to keep from falling  Turning to look behind: Numbers; 0-4: 2  4. Looks behind from both sides and weight shifts well  3. Looks behind one side only, other side less weight shift  2. Turns sideways only, maintains balance  1. Needs supervision when turning  0. Needs assistance  Turning 360 degrees: Numbers; 0-4: 4  4. Able to turn in </=4 seconds  3. Able to turn on one side in </= 4 seconds   2. Able to turn slowly, but safely  1. Needs supervision or verbal cueing  0. Needs assistance Place alternate foot on stool: Numbers; 0-4: 4 4. Completes 8 steps in 20 seconds 3. Completes 8 steps in >20 seconds 2. 4 steps without assistance/supervision 1. Completes >2 steps with minimal assist 0. Unable, needs assist to keep  from falling Standing with one foot in front: Numbers; 0-4: 3  4. Independent tandem for 30 seconds  3. Independent foot ahead for 30 seconds  2. Independent small step for 30 seconds  1. Needs help to step, but can hold for 15 seconds  0. Loses balance while standing/stepping Standing on one foot: Numbers; 0-4: 1 4. Holds >10 seconds 3. Holds 5-10 seconds 2. Holds >/=3 seconds  1. Holds <3 seconds 0. Unable   R , 2 sec  L  < 3 sec Total Score: 50/56   TODAY'S TREATMENT:   [facility] Adult PT Treatment:                                                DATE: 07/02/2022 Therapeutic Exercise: Supine chin tuck 1 x 10 holding 5 sec ea - verbal cue sfor  proper from Upper trap stretch bil 1 x 30 sec Levator scapulae stretch 1 x 30 sec Seated scapular retraction 2 x 10 - cues to avoid hiking the shoulders Manual Therapy: IASTM along bil upper trap Sub-occipital release   Trigger Point Dry-Needling  Treatment instructions: Expect mild to moderate muscle soreness. S/S of pneumothorax if dry needled over a lung field, and to seek immediate medical attention should they occur. Patient verbalized understanding of these instructions and education.  Patient Consent Given: Yes Education handout provided: Yes Muscles treated: bil upper trap Electrical stimulation performed: No Parameters: N/A Treatment response/outcome: twitch response noted, and pt tolerated treatment well.  Therapeutic Activity: Bed mobility using log rolling technique to reduce quick motions and utilizing breathing while performing transfer   06-27-22  EVAL Issue exercise HEP, initial explanation of orthostatic hypotension.  Fall prevention  EVAL   PATIENT EDUCATION:  Education details: POC, Explanation of findings , initial HEP issued  FOTO done Person educated: Patient Education method: Explanation, Demonstration, Tactile cues, Verbal cues, and Handouts Education comprehension: verbalized understanding, returned demonstration, verbal cues required, tactile cues required, and needs further education   HOME EXERCISE PROGRAM: Access Code: [email or web address] URL: [email or web address] Date: 06/27/2022 Prepared by: Voncille Lo  Exercises - Supine Deep Neck Flexor Training  - 2-3 x daily - 7 x weekly - 10 reps - 3 hold - Seated Cervical Retraction Protraction AROM  - 2-3 x daily - 7 x weekly - 1 sets - 10 reps - Seated Gentle Upper Trapezius Stretch  - 1 x daily - 7 x weekly - 1 sets - 3 reps - 30 hold - Gentle Levator Scapulae Stretch  - 1 x daily - 7 x weekly - 3 sets - 3 reps - 30 hold  ASSESSMENT:  CLINICAL IMPRESSION: Mrs Briel arrives to her  follow up session reporting no pain today but does continue to report stiffness in bil upper trap. She reports consistency with her HEP but reported some uncertainty if she was doing them correctly. Educated and consent was given for TPDN focusing on bil upper trap in supine position followed with IASTM techniques and sub-occipital release. Time was taken to review her HEP which she needed verbal cueing and demonstration for. Practiced log rolling to help slow her supine to sit transition and reduce effects of orthostatic hypotension.  End of session she reported decreased stiffness.    OBJECTIVE IMPAIRMENTS decreased balance, decreased ROM, decreased strength, dizziness, and hypomobility.   ACTIVITY LIMITATIONS  turning head for driving and  sleeping   PARTICIPATION LIMITATIONS: driving  PERSONAL FACTORS HOH with hearing aids (very HOH), Anxiety/depression( well controlled, GERD,hypercholesteremia, obesity, OA, osteoporosis, sleep apnea with CPAP are also affecting patient's functional outcome.   REHAB POTENTIAL: Good  CLINICAL DECISION MAKING: Evolving/moderate complexity  EVALUATION COMPLEXITY: Moderate   GOALS: Goals reviewed with patient?  Partially will need to review 2nd visit  SHORT TERM GOALS: Target date: 07/30/2022   Pt will be independent with initial HEP Baseline: limited knowledge Goal status: INITIAL  2.  Pt will be educated on sleeping postures and position to improve sleep quality at night for more restorative sleep Baseline: unable to sleep on back due to neck pain Goal status: INITIAL  3.  Demonstrate understanding of proper sitting posture, body mechanics, work ergonomics, and be more conscious of position and posture throughout the day.  Baseline: forward head posture moderate to severe  Goal status: INITIAL  4.  Report decreased pain by 25% in neck Baseline: 9/10 trying to turn neck Goal status: INITIAL   LONG TERM GOALS: Target date: 08/27/2022  Pt will  be independent with  advanced HEP Baseline: limited knowledge Goal status: INITIAL  2.  Pt will be able to demonstrate getting up and down from the floor to decrease fear of falling Baseline: avoids falling Goal status: INITIAL  3.  Pt will improve BERG to at least 55/56 to show improved balance  Baseline: 50/56 BERG eval Goal status: INITIAL  4.  FOTO will improve from  44%  to   55%  indicating improved functional mobility  Baseline: 44% eval Goal status: INITIAL  5.  Improve UE MMT to at least 4 to 4+/5 to improve posture and ability to visually scan environment Baseline: 10 cm occiput to head measure  10 cm at risk for fall with forward head Goal status: INITIAL    PLAN: PT FREQUENCY: 1-2x/week  PT DURATION: 8 weeks  PLANNED INTERVENTIONS: Therapeutic exercises, Therapeutic activity, Neuromuscular re-education, Balance training, Gait training, Patient/Family education, Self Care, Joint mobilization, Dry Needling, Electrical stimulation, Spinal mobilization, Cryotherapy, Moist heat, Taping, Ionotophoresis '4mg'$ /ml Dexamethasone, Manual therapy, and Re-evaluationVestibular as needed.  PLAN FOR NEXT SESSION: review HEP and manual and perhaps TPDN.    Response to [facility]   Jillienne Egner PT, DPT, LAT, ATC  07/02/22  4:01 PM

## 2022-07-08 ENCOUNTER — Encounter: Payer: Self-pay | Admitting: Pulmonary Disease

## 2022-07-08 ENCOUNTER — Ambulatory Visit (INDEPENDENT_AMBULATORY_CARE_PROVIDER_SITE_OTHER): Payer: Medicare PPO | Admitting: Pulmonary Disease

## 2022-07-08 VITALS — BP 126/84 | HR 90 | Temp 98.0°F | Ht 66.5 in | Wt 189.9 lb

## 2022-07-08 DIAGNOSIS — Z23 Encounter for immunization: Secondary | ICD-10-CM | POA: Diagnosis not present

## 2022-07-08 DIAGNOSIS — G4733 Obstructive sleep apnea (adult) (pediatric): Secondary | ICD-10-CM | POA: Diagnosis not present

## 2022-07-08 NOTE — Patient Instructions (Signed)
X obtain sleep study results from [facility] or [facility]  X Rx to adapt for new airfit F20 small full face mask for her  Obtain CPAP download   X Home sleep test

## 2022-07-08 NOTE — Assessment & Plan Note (Signed)
obtain sleep study results from [facility] or [facility]  X Rx to adapt for new airfit F20 small full face mask for her  Obtain CPAP download -her current machine is set at 13 cm.  Given 20 pound weight loss, it is possible that degree of sleep disordered breathing is improved and we will reassess with a home sleep test, especially since it would be difficult to track down her old study. Based on this she may qualify for a new CPAP machine   The pathophysiology of obstructive sleep apnea , it's cardiovascular consequences & modes of treatment including CPAP were discused with the patient in detail & they evidenced understanding.  Weight loss encouraged, compliance with goal of at least 4-6 hrs every night is the expectation. Advised against medications with sedative side effects Cautioned against driving when sleepy - understanding that sleepiness will vary on a day to day basis

## 2022-07-08 NOTE — Progress Notes (Signed)
Subjective:    Patient ID: Christie Harris, female    DOB: 1940/12/31, 81 y.o.   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 She has been referred by neurology whom she was seeing for dizziness attributed to orthostatic hypotension.  She complained of head pain and it was felt that this may be related to CPAP straps.  PMH -  DM2 c/b neuropathy, HLD, anxiety, depression, OSA (on CPAP), OA, hearing loss, GERD   OSA was diagnosed more than 10 years ago by polysomnogram done in [place] and she has not followed with a sleep doctor since.  She has been maintained on CPAP of 13 cm, she brings her CPAP apparatus with her.  She has an AirSense 10 and a ResMed Quatro fullface mask.  She has not obtained CPAP supplies in the long time. Epworth sleepiness score is 4 and she denies daytime somnolence or sleep pressure. Bedtime is midnight to 1 AM, sleep latency about 30 minutes she reports 2-3 nocturnal awakenings for nocturia, is out of bed around 10 AM feeling rested without headaches but reports dryness of mouth. She would like a new humidifier  There is no history suggestive of cataplexy, sleep paralysis or parasomnias She has lost about 20 pounds to her current weight of 190 pounds   Past Medical History:  Diagnosis Date   Anxiety    Arthritis    greater left hip   CA of skin    squamous cell right leg-recent excision   Depression    Depression    GERD (gastroesophageal reflux disease)    Hypercholesteremia    Obesity    Osteoarthrosis    Osteoporosis    Shortness of breath    on exertion   Sleep apnea    CPAP at 13cm H2o   Sleep apnea    Past Surgical History:  Procedure Laterality Date   ABDOMINAL HYSTERECTOMY     uterus and ovaries   CATARACT EXTRACTION Right    CHOLECYSTECTOMY     COLONOSCOPY WITH PROPOFOL N/A 03/23/2013   Procedure: COLONOSCOPY WITH PROPOFOL;  Surgeon: Garlan Fair, MD;  Location: [facility];   Service: Endoscopy;  Laterality: N/A;colon polyps removed-benign   ESOPHAGOGASTRODUODENOSCOPY (EGD) WITH PROPOFOL N/A 06/16/2014   Procedure: ESOPHAGOGASTRODUODENOSCOPY (EGD) WITH PROPOFOL;  Surgeon: Garlan Fair, MD;  Location: [facility];  Service: Endoscopy;  Laterality: N/A;   plastic surgery on nose     "deviated septum and cosmetic"    Allergies  Allergen Reactions   Darvocet [Propoxyphene N-Acetaminophen] Other (See Comments)    "out of my mind"   Scallops [Shellfish Allergy] Nausea And Vomiting    Social History   Socioeconomic History   Marital status: Divorced    Spouse name: Not on file   Number of children: Not on file   Years of education: Not on file   Highest education level: Not on file  Occupational History   Not on file  Tobacco Use   Smoking status: Never    Passive exposure: Past   Smokeless tobacco: Never  Vaping Use   Vaping Use: Never used  Substance and Sexual Activity   Alcohol use: Yes    Comment: wine weekly   Drug use: No   Sexual activity: Not Currently  Other Topics Concern   Not on file  Social History Narrative   Right handed   Caffeine 1-2 mug in am 2 coke in pm    Live in  a two story home with daughter   Social Determinants of Health   Financial Resource Strain: Not on file  Food Insecurity: Not on file  Transportation Needs: Not on file  Physical Activity: Not on file  Stress: Not on file  Social Connections: Not on file  Intimate Partner Violence: Not on file    Family History  Problem Relation Age of Onset   Breast cancer Mother    Diabetes Father    Congestive Heart Failure Father    Hypertension Father    Lung cancer Sister    Heart attack Brother    Kidney disease Brother    Hypertension Brother    Kidney disease Brother      Review of Systems     Objective:   Physical Exam   Gen. Pleasant, obese, in no distress, normal affect ENT - no pallor,icterus, no post nasal drip, class 2-3 airway Neck: No  JVD, no thyromegaly, no carotid bruits Lungs: no use of accessory muscles, no dullness to percussion, decreased without rales or rhonchi  Cardiovascular: Rhythm regular, heart sounds  normal, no murmurs or gallops, no peripheral edema Abdomen: soft and non-tender, no hepatosplenomegaly, BS normal. Musculoskeletal: No deformities, no cyanosis or clubbing Neuro:  alert, non focal, no tremors        Assessment & Plan:

## 2022-07-09 ENCOUNTER — Encounter: Payer: Self-pay | Admitting: Physical Therapy

## 2022-07-09 ENCOUNTER — Ambulatory Visit: Payer: Medicare PPO | Admitting: Physical Therapy

## 2022-07-09 DIAGNOSIS — R296 Repeated falls: Secondary | ICD-10-CM

## 2022-07-09 DIAGNOSIS — M6281 Muscle weakness (generalized): Secondary | ICD-10-CM

## 2022-07-09 DIAGNOSIS — M542 Cervicalgia: Secondary | ICD-10-CM | POA: Diagnosis not present

## 2022-07-09 DIAGNOSIS — R293 Abnormal posture: Secondary | ICD-10-CM | POA: Diagnosis not present

## 2022-07-09 NOTE — Therapy (Signed)
OUTPATIENT PHYSICAL THERAPY CERVICAL/BALANCE EVALUATION   Patient Name: Christie Harris MRN: [id number] DOB:1940/11/28, 81 y.o., female Today's Date: 07/09/2022   PT End of Session - 07/09/22 1528     Visit Number 3    Number of Visits 16    Date for PT Re-Evaluation 08/22/22    Authorization Type [employer] [place] MEDICARE CHOICE PPO    PT Start Time 1530    PT Stop Time 1630    PT Time Calculation (min) 60 min    Activity Tolerance Patient tolerated treatment well    Behavior During Therapy WFL for tasks assessed/performed               Past Medical History:  Diagnosis Date   Anxiety    Arthritis    greater left hip   CA of skin    squamous cell right leg-recent excision   Depression    Depression    GERD (gastroesophageal reflux disease)    Hypercholesteremia    Obesity    Osteoarthrosis    Osteoporosis    Shortness of breath    on exertion   Sleep apnea    CPAP at 13cm H2o   Sleep apnea    Past Surgical History:  Procedure Laterality Date   ABDOMINAL HYSTERECTOMY     uterus and ovaries   CATARACT EXTRACTION Right    CHOLECYSTECTOMY     COLONOSCOPY WITH PROPOFOL N/A 03/23/2013   Procedure: COLONOSCOPY WITH PROPOFOL;  Surgeon: Garlan Fair, MD;  Location: [facility];  Service: Endoscopy;  Laterality: N/A;colon polyps removed-benign   ESOPHAGOGASTRODUODENOSCOPY (EGD) WITH PROPOFOL N/A 06/16/2014   Procedure: ESOPHAGOGASTRODUODENOSCOPY (EGD) WITH PROPOFOL;  Surgeon: Garlan Fair, MD;  Location: [facility];  Service: Endoscopy;  Laterality: N/A;   plastic surgery on nose     "deviated septum and cosmetic"   Patient Active Problem List   Diagnosis Date Noted   OSA on CPAP 07/15/2013   Obesity (BMI 30-39.9) 07/15/2013   GERD (gastroesophageal reflux disease) 07/15/2013   Depression 07/15/2013    PCP: Seward Carol, MD  REFERRING PROVIDER: Shellia Carwin, MD  REFERRING DIAG:  M54.2 (ICD-10-CM) - Neck pain  E11.42 (ICD-10-CM) -  Diabetic polyneuropathy associated with type 2 diabetes mellitus ([place])  R26.81 (ICD-10-CM) - Gait instability  I95.1 (ICD-10-CM) - Orthostatic hypotension  I95.1 (ICD-10-CM) - Orthostatic intolerance    THERAPY DIAG:  Cervicalgia  Muscle weakness (generalized)  Repeated falls  Abnormal posture  Rationale for Evaluation and Treatment Rehabilitation  ONSET DATE: neck pain  has been > 5 years,   balance problems 2 years or more  SUBJECTIVE:  SUBJECTIVE STATEMENT: I have no pain but when I bend my head down to my chest I feel tight.  Last night I used a hard pillow and a soft pillow and my head still hurt.  I finally fell asleep  PERTINENT HISTORY:  HOH with hearing aids (very HOH), Anxiety/depression( well controlled, GERD,hypercholesteremia, obesity, OA, osteoporosis, sleep apnea with CPAP  PAIN: 07-09-22    6/10 over bil upper traps and levator  07-09-22 Are you having pain? Yes: NPRS scale: 0/10 now at rest  but can at worst 9/10 Pain location: back of neck up into head R >L Pain description: thorbbing, more dull pain Aggravating factors: sleeping on back/lying down, turning head to drive or to even turn my head to watch TV and it throbs, any time I do not have support for my head, increases with tension Relieving factors: none  PRECAUTIONS: Fall  WEIGHT BEARING RESTRICTIONS No  FALLS:  Has patient fallen in last 6 months? Yes. Number of falls 2 times  once when I was [place] and then the following day I fell at my granddaughters house getting out of a air mattress between wall and bed  and had no room and I fell.  I feel unsteady when I feel dizzy or lightheaded  LIVING ENVIRONMENT: Lives with: lives with their family and lives with their  daughter Lives in: House/apartment Stairs: Yes: Internal: 14 steps; on left going up and External: 1 steps; none Has following equipment at home:  built in bench in shower Pt room is on the first floor and she avoids stairs OCCUPATION: retired  PLOF: Independent  PATIENT GOALS  Decrease neck pain,  decrease risk of falls, improve balance, drive with more comfort. Sleep more comfortably at night  OBJECTIVE:   DIAGNOSTIC FINDINGS:  IMPRESSION:04-29-22 No acute intracranial abnormality.  PATIENT SURVEYS:  FOTO 44%  55% predicted   COGNITION: Overall cognitive status: Within functional limits for tasks assessed   SENSATION: Pt reports tingling in bil  hands after waking up in the morning  POSTURE: rounded shoulders, forward head, and obesity  and mild thoracic kyphosis Occiput to wall  10 cm   10 cm at risk for falls PALPATION: TTP   CERVICAL ROM:   Active ROM A/PROM (deg) eval  Flexion 30  Extension 33  Right lateral flexion 20  Left lateral flexion 15  Right rotation 20 pain  Left rotation 23 pain   (Blank rows = not tested)  UPPER EXTREMITY ROM:  Active ROM Right eval Left eval  Shoulder flexion 136/ P149 140/ P149  Shoulder extension    Shoulder abduction 130 134  Shoulder adduction    Shoulder extension    Shoulder internal rotation T-12 t_12  Shoulder external rotation C-7 C-6  Elbow flexion    Elbow extension    Wrist flexion    Wrist extension    Wrist ulnar deviation    Wrist radial deviation    Wrist pronation    Wrist supination     (Blank rows = not tested)  UPPER EXTREMITY MMT:  MMT Right eval Left eval  Shoulder flexion 5 5  Shoulder extension    Shoulder abduction 5 5  Shoulder adduction    Shoulder extension    Shoulder internal rotation 4 4  Shoulder external rotation 4 4  Middle trapezius 4- 4-  Lower trapezius 4- 4-  Elbow flexion    Elbow extension    Wrist flexion  Wrist extension    Wrist ulnar deviation    Wrist  radial deviation    Wrist pronation    Wrist supination    Grip strength 46lb 54 lb   (Blank rows = not tested)  CERVICAL SPECIAL TESTS:  Neck flexor muscle endurance test: Positive, Spurling's test: Negative, and Distraction test: Negative  BP  142/82  pulse 82 FUNCTIONAL TESTS:  5 times sit to stand: 10.98 sec   13 sec or less no risk of fall 6 minute walk test: TBA Berg Balance Scale: 50/56 BERG BALANCE  Sitting to Standing: Numbers; 0-4: 4  4. Stands without using hands and stabilize independently  3. Stands independently using hands  2. Stands using hands after multiple trials  1. Min A to stand  0. Mod-Max A to stand Standing unsupported: Numbers; 0-4: 4  4. Stands safely for 2 minutes  3. Stands 2 minutes with supervision  2. Stands 30 seconds unsupported  1. Needs several tries to stand unsupported for 30 seconds  0. Unable to stand unsupported for 30 seconds Sitting unsupported: Numbers; 0-4: 4  4. Sits for 2 minutes independently  3. Sits for 2 minutes with supervision  2. Able to sit 30 seconds  1. Able to sit 10 seconds  0. Unable to sit for 10 seconds Standing to Sitting: Numbers; 0-4: 4 4. Sits safely with minimal use of hands 3. Controls descent with hands 2. Uses back of legs against chair to control descent 1. Sits independently, but uncontrolled descent 0. Needs assistance Transfers: Numbers; 0-4: 4  4. Transfers safely with minor use of hands  3. Transfers safely definite use of hands  2. Transfers with verbal cueing/supervision  1. Needs 1 person assist  0. Needs 2 person assist  Standing with eyes closed: Numbers; 0-4: 4  4. Stands safely for 10 seconds  3. Stands 10 seconds with supervision   2. Able to stand for 3 seconds  1. Unable to keep eyes closed for 3 seconds, but is safe  0. Needs assist to keep from falling Standing with feet together: Numbers; 0-4: 4 4. Stands for 1 minute safely 3. Stands for 1 minute with supervision 2. Unable  to hold for 30 seconds  1. Needs help to attain position but can hold for 15 seconds  0. Needs help to attain position and unable to hold for 15 seconds Reaching forward with outstretched arm: Numbers; 0-4: 4  4. Reaches forward 10 inches  3. Reaches forward 5 inches  2. Reaches forward 2 inches  1. Reaches forward with supervision  0. Loses balance/requires assistace Retrieving object from the floor: Numbers; 0-4: 4 4. Able to pick up easily and safely 3. Able to pick up with supervision 2. Unable to pick up, but reaches within 1-2 inches independently 1. Unable to pick up and needs supervision 0. Unable/needs assistance to keep from falling  Turning to look behind: Numbers; 0-4: 2  4. Looks behind from both sides and weight shifts well  3. Looks behind one side only, other side less weight shift  2. Turns sideways only, maintains balance  1. Needs supervision when turning  0. Needs assistance  Turning 360 degrees: Numbers; 0-4: 4  4. Able to turn in </=4 seconds  3. Able to turn on one side in </= 4 seconds   2. Able to turn slowly, but safely  1. Needs supervision or verbal cueing  0. Needs assistance Place alternate foot on stool: Numbers; 0-4: 4 4. Completes  8 steps in 20 seconds 3. Completes 8 steps in >20 seconds 2. 4 steps without assistance/supervision 1. Completes >2 steps with minimal assist 0. Unable, needs assist to keep from falling Standing with one foot in front: Numbers; 0-4: 3  4. Independent tandem for 30 seconds  3. Independent foot ahead for 30 seconds  2. Independent small step for 30 seconds  1. Needs help to step, but can hold for 15 seconds  0. Loses balance while standing/stepping Standing on one foot: Numbers; 0-4: 1 4. Holds >10 seconds 3. Holds 5-10 seconds 2. Holds >/=3 seconds  1. Holds <3 seconds 0. Unable   R , 2 sec  L  < 3 sec Total Score: 50/56   TODAY'S TREATMENT:  [facility] Adult PT Treatment:                                                 DATE: 07-09-22 Therapeutic Exercise: Supine chin tuck 1 x 10 holding 10 sec ea - verbal cues for proper from Upper trap stretch bil 1 x 30 sec  on R and LVC for amount of stretch to tightness but not pain  Levator scapulae stretch 1 x 30 sec on R and L Seated scapular retraction 2 x 10 - cues to avoid hiking the shoulders Standing Shoulder Diagonal Horizontal Abduction 60/120 Degrees with RTB  3 x 10 with VC for feet flat and not elevating upper traps Standing Shoulder Horizontal Abduction with  RTB  3x 10 with VC for feet flat and not elevating upper traps Sidelying Open Book Thoracic Lumbar Rotation and Extension   and taught standing due to pt nausea and did not want to lie down. Single Leg Stance with Support  - Manual Therapy: STW along bil upper trap Cervical distraction with rotation by PT  Pt became nauseated and PT stopped to address.  Pt nausea reduced and she was able to continue Sub-occipital release in sitting at end of session In supine, when PT performing head rolls in supine. Pt became nauseated. Pt sat up on edge of mat and advised to take deep breaths    Trigger Point Dry-Needling  Treatment instructions: Expect mild to moderate muscle soreness. S/S of pneumothorax if dry needled over a lung field, and to seek immediate medical attention should they occur. Patient verbalized understanding of these instructions and education. Pt needed to use massage chair in order to comfortably receive TPDN Patient Consent Given: Yes Education handout provided: Yes Muscles treated: bil upper trap  needles 30 x .32m Electrical stimulation performed: No Parameters: N/A Treatment response/outcome: twitch response noted, and pt tolerated treatment well. SELF CARE-  Pt given handout for body mechanics and posture and ADL's     Sleep postures for sidelying and on back.   Reviewed all information for ADL/ household chores Modalities - moist hot pack  [facility] Adult PT Treatment:                                                 DATE: 07/02/2022 Therapeutic Exercise: Supine chin tuck 1 x 10 holding 5 sec ea - verbal cue sfor proper from Upper trap stretch bil 1 x 30 sec Levator scapulae stretch 1 x 30 sec  Seated scapular retraction 2 x 10 - cues to avoid hiking the shoulders Manual Therapy: IASTM along bil upper trap Sub-occipital release   Trigger Point Dry-Needling  Treatment instructions: Expect mild to moderate muscle soreness. S/S of pneumothorax if dry needled over a lung field, and to seek immediate medical attention should they occur. Patient verbalized understanding of these instructions and education.  Patient Consent Given: Yes Education handout provided: Yes Muscles treated: bil upper trap Electrical stimulation performed: No Parameters: N/A Treatment response/outcome: twitch response noted, and pt tolerated treatment well.  Therapeutic Activity: Bed mobility using log rolling technique to reduce quick motions and utilizing breathing while performing transfer   06-27-22  EVAL Issue exercise HEP, initial explanation of orthostatic hypotension.  Fall prevention  EVAL   PATIENT EDUCATION:  Education details: POC, Explanation of findings , initial HEP issued  FOTO done Person educated: Patient Education method: Explanation, Demonstration, Tactile cues, Verbal cues, and Handouts Education comprehension: verbalized understanding, returned demonstration, verbal cues required, tactile cues required, and needs further education   HOME EXERCISE PROGRAM: Access Code: [email or web address] URL: [email or web address] Date: 07/09/2022 Prepared by: Voncille Lo  Exercises - Supine Deep Neck Flexor Training  - 2-3 x daily - 7 x weekly - 10 reps - 3 hold - Seated Cervical Retraction Protraction AROM  - 2-3 x daily - 7 x weekly - 1 sets - 10 reps - Seated Gentle Upper Trapezius Stretch  - 1 x daily - 7 x weekly - 1 sets - 3 reps - 30 hold - Gentle Levator Scapulae  Stretch  - 1 x daily - 7 x weekly - 3 sets - 3 reps - 30 hold - Standing Shoulder Diagonal Horizontal Abduction 60/120 Degrees with Resistance  - 1 x daily - 7 x weekly - 3 sets - 10 reps - Standing Shoulder Horizontal Abduction with Resistance  - 1 x daily - 7 x weekly - 3 sets - 10 reps - Sidelying Open Book Thoracic Lumbar Rotation and Extension  - 1 x daily - 7 x weekly - 3 sets - 10 reps - Single Leg Stance with Support  - 1 x daily - 7 x weekly - 1 sets - 3 reps - 30-45 sec  hold  ASSESSMENT:  CLINICAL IMPRESSION: Christie Harris arrives to her follow up session reporting 6/10 pain after working in gym.  Pt consented to TPDN and had no adverse effects after being montiored closely while pt on massage chair for positioning.  PT did perform STW and cervical distraction with rotation and pt became naseated.  PT reviewed her HEP and strength and balance added. . Time was taken to review her HEP which she needed verbal cueing and demonstration for. Pt also educated on posture and body mechanics and ADL/household chores. Pt will benefit from working on lifting principles next session for safety and decrease of hyperextension of neck. STG # 1 and 4 met.  Pt will continue until completion of goals.    OBJECTIVE IMPAIRMENTS decreased balance, decreased ROM, decreased strength, dizziness, and hypomobility.   ACTIVITY LIMITATIONS  turning head for driving and sleeping   PARTICIPATION LIMITATIONS: driving  [place] with hearing aids (very HOH), Anxiety/depression( well controlled, GERD,hypercholesteremia, obesity, OA, osteoporosis, sleep apnea with CPAP are also affecting patient's functional outcome.   REHAB POTENTIAL: Good  CLINICAL DECISION MAKING: Evolving/moderate complexity  EVALUATION COMPLEXITY: Moderate   GOALS: Goals reviewed with patient?  Partially will need to review 2nd visit  SHORT TERM GOALS: Target date: 07/30/2022  Pt will be independent with initial  HEP Baseline: limited knowledge Goal status: MET  2.  Pt will be educated on sleeping postures and position to improve sleep quality at night for more restorative sleep Baseline: unable to sleep on back due to neck pain Goal status:ONGOING  3.  Demonstrate understanding of proper sitting posture, body mechanics, work ergonomics, and be more conscious of position and posture throughout the day.  Baseline: forward head posture moderate to severe , 07-09-22 given handout on posture and body mechanics Goal status: ONGOING  4.  Report decreased pain by 25% in neck Baseline: 9/10 trying to turn neck, 07-09-22  at night 6/10 but in clinic 2/10 Goal status: MET   LONG TERM GOALS: Target date: 08/27/2022  Pt will be independent with  advanced HEP Baseline: limited knowledge Goal status: INITIAL  2.  Pt will be able to demonstrate getting up and down from the floor to decrease fear of falling Baseline: avoids falling Goal status: INITIAL  3.  Pt will improve BERG to at least 55/56 to show improved balance  Baseline: 50/56 BERG eval Goal status: INITIAL  4.  FOTO will improve from  44%  to   55%  indicating improved functional mobility  Baseline: 44% eval Goal status: INITIAL  5.  Improve UE MMT to at least 4 to 4+/5 to improve posture and ability to visually scan environment Baseline: 10 cm occiput to head measure  10 cm at risk for fall with forward head Goal status: INITIAL    PLAN: PT FREQUENCY: 1-2x/week  PT DURATION: 8 weeks  PLANNED INTERVENTIONS: Therapeutic exercises, Therapeutic activity, Neuromuscular re-education, Balance training, Gait training, Patient/Family education, Self Care, Joint mobilization, Dry Needling, Electrical stimulation, Spinal mobilization, Cryotherapy, Moist heat, Taping, Ionotophoresis 41m/ml Dexamethasone, Manual therapy, and Re-evaluationVestibular as needed.  PLAN FOR NEXT SESSION:  Pt went over body mechanics and posture and ADL  will need  more demo for lifting and explanation  continue strength, balance and sit to stand for improvement of BERG    LVoncille Lo PT, AHshs St Elizabeth'S HospitalCertified Exercise Expert for the Aging Adult  07/09/22 4:43 PM Phone: [phone]: [phone]

## 2022-07-09 NOTE — Patient Instructions (Addendum)
Access Code: [email or web address] URL: [email or web address] Date: 07/09/2022 Prepared by: Voncille Lo  Exercises - Supine Deep Neck Flexor Training  - 2-3 x daily - 7 x weekly - 10 reps - 3 hold - Seated Cervical Retraction Protraction AROM  - 2-3 x daily - 7 x weekly - 1 sets - 10 reps - Seated Gentle Upper Trapezius Stretch  - 1 x daily - 7 x weekly - 1 sets - 3 reps - 30 hold - Gentle Levator Scapulae Stretch  - 1 x daily - 7 x weekly - 3 sets - 3 reps - 30 hold - Standing Shoulder Diagonal Horizontal Abduction 60/120 Degrees with Resistance  - 1 x daily - 7 x weekly - 3 sets - 10 reps - Standing Shoulder Horizontal Abduction with Resistance  - 1 x daily - 7 x weekly - 3 sets - 10 reps - Sidelying Open Book Thoracic Lumbar Rotation and Extension  - 1 x daily - 7 x weekly - 3 sets - 10 reps - Single Leg Stance with Support  - 1 x daily - 7 x weekly - 1 sets - 3 reps - 30-45 sec  hold Sleeping on Back  Place pillow under knees. A pillow with cervical support and a roll around waist are also helpful. Copyright  VHI. All rights reserved.  Sleeping on Side Place pillow between knees. Use cervical support under neck and a roll around waist as needed. Copyright  VHI. All rights reserved.   Sleeping on Stomach   If this is the only desirable sleeping position, place pillow under lower legs, and under stomach or chest as needed.  Posture - Sitting   Sit upright, head facing forward. Try using a roll to support lower back. Keep shoulders relaxed, and avoid rounded back. Keep hips level with knees. Avoid crossing legs for long periods. Stand to Sit / Sit to Stand   To sit: Bend knees to lower self onto front edge of chair, then scoot back on seat. To stand: Reverse sequence by placing one foot forward, and scoot to front of seat. Use rocking motion to stand up.   Work Height and Reach  Ideal work height is no more than 2 to 4 inches below elbow level when standing, and at  elbow level when sitting. Reaching should be limited to arm's length, with elbows slightly bent.  Bending  Bend at hips and knees, not back. Keep feet shoulder-width apart.    Posture - Standing   Good posture is important. Avoid slouching and forward head thrust. Maintain curve in low back and align ears over shoul- ders, hips over ankles.  Alternating Positions   Alternate tasks and change positions frequently to reduce fatigue and muscle tension. Take rest breaks. Computer Work   Position work to Programmer, multimedia. Use proper work and seat height. Keep shoulders back and down, wrists straight, and elbows at right angles. Use chair that provides full back support. Add footrest and lumbar roll as needed.  Getting Into / Out of Car  Lower self onto seat, scoot back, then bring in one leg at a time. Reverse sequence to get out.  Dressing  Lie on back to pull socks or slacks over feet, or sit and bend leg while keeping back straight.    Housework - Sink  Place one foot on ledge of cabinet under sink when standing at sink for prolonged periods.   Pushing / Pulling  Pushing is preferable to pulling. Keep back in proper alignment, and  use leg muscles to do the work.  Deep Squat   Squat and lift with both arms held against upper trunk. Tighten stomach muscles without holding breath. Use smooth movements to avoid jerking.  Avoid Twisting   Avoid twisting or bending back. Pivot around using foot movements, and bend at knees if needed when reaching for articles.  Carrying Luggage   Distribute weight evenly on both sides. Use a cart whenever possible. Do not twist trunk. Move body as a unit.   Lifting Principles Maintain proper posture and head alignment. Slide object as close as possible before lifting. Move obstacles out of the way. Test before lifting; ask for help if too heavy. Tighten stomach muscles without holding breath. Use smooth movements; do not jerk. Use  legs to do the work, and pivot with feet. Distribute the work load symmetrically and close to the center of trunk. Push instead of pull whenever possible.   Ask For Help   Ask for help and delegate to others when possible. Coordinate your movements when lifting together, and maintain the low back curve.  Log Roll   Lying on back, bend left knee and place left arm across chest. Roll all in one movement to the right. Reverse to roll to the left. Always move as one unit. Housework - Sweeping  Use long-handled equipment to avoid stooping.   Housework - Wiping  Position yourself as close as possible to reach work surface. Avoid straining your back.  Laundry - Unloading Wash   To unload small items at bottom of washer, lift leg opposite to arm being used to reach.  [place] close to area to be raked. Use arm movements to do the work. Keep back straight and avoid twisting.     Cart  When reaching into cart with one arm, lift opposite leg to keep back straight.   Getting Into / Out of Bed  Lower self to lie down on one side by raising legs and lowering head at the same time. Use arms to assist moving without twisting. Bend both knees to roll onto back if desired. To sit up, start from lying on side, and use same move-ments in reverse. Housework - Vacuuming  Hold the vacuum with arm held at side. Step back and forth to move it, keeping head up. Avoid twisting.   Laundry - IT consultant so that bending and twisting can be avoided.   Laundry - Unloading Dryer  Squat down to reach into clothes dryer or use a reacher.  Gardening - Weeding / Probation officer or Kneel. Knee pads may be helpful.                     Voncille Lo, PT, [place] Certified Exercise Expert for the Aging Adult  07/09/22 4:01 PM Phone: [phone] Fax: [phone]

## 2022-07-12 ENCOUNTER — Telehealth: Payer: Self-pay | Admitting: Pulmonary Disease

## 2022-07-12 NOTE — Telephone Encounter (Signed)
  Return in about 6 months (around 01/07/2023) for APP/ me. X obtain sleep study results from [facility]   X Rx to adapt for new airfit F20 small full face mask for her   Obtain CPAP download    X Home sleep test     Checked patient's chart. An order was placed for a new cpap mask and cpap machine. The cpap machine order did not have any settings on it.   RA, can you please advise? Thanks!

## 2022-07-12 NOTE — Telephone Encounter (Signed)
New, Tobey Bride, Allyne Gee, [place]; [place], Eleonore Chiquito, [place]; [place]el[place], Desma Maxim,   What is this order for? We received the supply order and it looks like it was sent out on 07/10/22.  This looks like a new pap set up order? If so I need the order updated to show settings and I need face 2 face notes showing usage and benefit and /or a copy of the new sleep study from 10/20023.   please advise.   Thank you,   Demetrius Charity

## 2022-07-13 DIAGNOSIS — Z23 Encounter for immunization: Secondary | ICD-10-CM | POA: Diagnosis not present

## 2022-07-15 NOTE — Telephone Encounter (Signed)
Spoke with pt who states C-pap supplies arrived on 07/13/22. Pt is waiting to complete home sleep study to review order for new machine. Nothing further needed at this time.

## 2022-07-16 ENCOUNTER — Ambulatory Visit: Payer: Medicare PPO | Admitting: Physical Therapy

## 2022-07-16 ENCOUNTER — Encounter: Payer: Self-pay | Admitting: Physical Therapy

## 2022-07-16 DIAGNOSIS — R296 Repeated falls: Secondary | ICD-10-CM

## 2022-07-16 DIAGNOSIS — M6281 Muscle weakness (generalized): Secondary | ICD-10-CM

## 2022-07-16 DIAGNOSIS — M542 Cervicalgia: Secondary | ICD-10-CM | POA: Diagnosis not present

## 2022-07-16 DIAGNOSIS — R293 Abnormal posture: Secondary | ICD-10-CM | POA: Diagnosis not present

## 2022-07-16 NOTE — Therapy (Signed)
OUTPATIENT PHYSICAL THERAPY CERVICAL/BALANCE EVALUATION   Patient Name: Christie Harris MRN: [id number] DOB:[birth year], 81 y.o., female Today's Date: 07/16/2022   PT End of Session - 07/16/22 1458     Visit Number 4    Number of Visits 16    Date for PT Re-Evaluation 08/22/22    Authorization Type [employer] [place] MEDICARE CHOICE PPO    PT Start Time 1500    Activity Tolerance Patient tolerated treatment well    Behavior During Therapy WFL for tasks assessed/performed                Past Medical History:  Diagnosis Date   Anxiety    Arthritis    greater left hip   CA of skin    squamous cell right leg-recent excision   Depression    Depression    GERD (gastroesophageal reflux disease)    Hypercholesteremia    Obesity    Osteoarthrosis    Osteoporosis    Shortness of breath    on exertion   Sleep apnea    CPAP at 13cm H2o   Sleep apnea    Past Surgical History:  Procedure Laterality Date   ABDOMINAL HYSTERECTOMY     uterus and ovaries   CATARACT EXTRACTION Right    CHOLECYSTECTOMY     COLONOSCOPY WITH PROPOFOL N/A 03/23/2013   Procedure: COLONOSCOPY WITH PROPOFOL;  Surgeon: Garlan Fair, MD;  Location: [facility];  Service: Endoscopy;  Laterality: N/A;colon polyps removed-benign   ESOPHAGOGASTRODUODENOSCOPY (EGD) WITH PROPOFOL N/A 06/16/2014   Procedure: ESOPHAGOGASTRODUODENOSCOPY (EGD) WITH PROPOFOL;  Surgeon: Garlan Fair, MD;  Location: [facility];  Service: Endoscopy;  Laterality: N/A;   plastic surgery on nose     "deviated septum and cosmetic"   Patient Active Problem List   Diagnosis Date Noted   OSA on CPAP 07/15/2013   Obesity (BMI 30-39.9) 07/15/2013   GERD (gastroesophageal reflux disease) 07/15/2013   Depression 07/15/2013    PCP: Seward Carol, MD  REFERRING PROVIDER: Shellia Carwin, MD  REFERRING DIAG:  M54.2 (ICD-10-CM) - Neck pain  E11.42 (ICD-10-CM) - Diabetic polyneuropathy associated with type 2 diabetes  mellitus ([place])  R26.81 (ICD-10-CM) - Gait instability  I95.1 (ICD-10-CM) - Orthostatic hypotension  I95.1 (ICD-10-CM) - Orthostatic intolerance    THERAPY DIAG:  Cervicalgia  Muscle weakness (generalized)  Repeated falls  Abnormal posture  Rationale for Evaluation and Treatment Rehabilitation  ONSET DATE: neck pain  has been > 5 years,   balance problems 2 years or more  SUBJECTIVE:  SUBJECTIVE STATEMENT: After last time I  had a splitting headache and I felt nauseated after we worked on my neck.  I went home and felt nauseated all evening and the next morning I felt fine.  I just got back from the gym doing cardio and strength and at the end I did balance and I was terrible.  I got my Covid shot on Saturday afternoon. I am able to look better with my neck while driving   I did get a new CPAP machine that is much better.  PERTINENT HISTORY:  HOH with hearing aids (very HOH), Anxiety/depression( well controlled, GERD,hypercholesteremia, obesity, OA, osteoporosis, sleep apnea with CPAP  PAIN:   07-16-22   1/10 in neck and upper traps. 07-09-22    6/10 over bil upper traps and levator  07-09-22   Are you having pain? Yes: NPRS scale: 0/10 now at rest  but can at worst 9/10 Pain location: back of neck up into head R >L Pain description: thorbbing, more dull pain Aggravating factors: sleeping on back/lying down, turning head to drive or to even turn my head to watch TV and it throbs, any time I do not have support for my head, increases with tension Relieving factors: none  PRECAUTIONS: Fall  WEIGHT BEARING RESTRICTIONS No  FALLS:  Has patient fallen in last 6 months? Yes. Number of falls 2 times  once when I was [place]pping [place] and then the following day I  fell at my granddaughters house getting out of a air mattress between wall and bed  and had no room and I fell.  I feel unsteady when I feel dizzy or lightheaded  LIVING ENVIRONMENT: Lives with: lives with their family and lives with their daughter Lives in: House/apartment Stairs: Yes: Internal: 14 steps; on left going up and External: 1 steps; none Has following equipment at home:  built in bench in shower Pt room is on the first floor and she avoids stairs OCCUPATION: retired  PLOF: Independent  PATIENT GOALS  Decrease neck pain,  decrease risk of falls, improve balance, drive with more comfort. Sleep more comfortably at night  OBJECTIVE:   DIAGNOSTIC FINDINGS:  IMPRESSION:04-29-22 No acute intracranial abnormality.  PATIENT SURVEYS:  FOTO 44%  55% predicted   COGNITION: Overall cognitive status: Within functional limits for tasks assessed   SENSATION: Pt reports tingling in bil  hands after waking up in the morning  POSTURE: rounded shoulders, forward head, and obesity  and mild thoracic kyphosis Occiput to wall  10 cm   10 cm at risk for falls PALPATION: TTP   CERVICAL ROM:   Active ROM A/PROM (deg) eval   Flexion 30   Extension 33   Right lateral flexion 20   Left lateral flexion 15   Right rotation 20 pain 34 no pain  Left rotation 23 pain 30 no pain   (Blank rows = not tested)  UPPER EXTREMITY ROM:  Active ROM Right eval Left eval  Shoulder flexion 136/ P149 140/ P149  Shoulder extension    Shoulder abduction 130 134  Shoulder adduction    Shoulder extension    Shoulder internal rotation T-12 t_12  Shoulder external rotation C-7 C-6  Elbow flexion    Elbow extension    Wrist flexion    Wrist extension    Wrist ulnar deviation    Wrist radial deviation    Wrist pronation    Wrist supination     (Blank rows =  not tested)  UPPER EXTREMITY MMT:  MMT Right eval Left eval  Shoulder flexion 5 5  Shoulder extension    Shoulder abduction 5 5   Shoulder adduction    Shoulder extension    Shoulder internal rotation 4 4  Shoulder external rotation 4 4  Middle trapezius 4- 4-  Lower trapezius 4- 4-  Elbow flexion    Elbow extension    Wrist flexion    Wrist extension    Wrist ulnar deviation    Wrist radial deviation    Wrist pronation    Wrist supination    Grip strength 46lb 54 lb   (Blank rows = not tested)  CERVICAL SPECIAL TESTS:  Neck flexor muscle endurance test: Positive, Spurling's test: Negative, and Distraction test: Negative  BP  142/82  pulse 82 FUNCTIONAL TESTS:  5 times sit to stand: 10.98 sec   13 sec or less no risk of fall 6 minute walk test: TBA Berg Balance Scale: 50/56 BERG BALANCE  Sitting to Standing: Numbers; 0-4: 4  4. Stands without using hands and stabilize independently  3. Stands independently using hands  2. Stands using hands after multiple trials  1. Min A to stand  0. Mod-Max A to stand Standing unsupported: Numbers; 0-4: 4  4. Stands safely for 2 minutes  3. Stands 2 minutes with supervision  2. Stands 30 seconds unsupported  1. Needs several tries to stand unsupported for 30 seconds  0. Unable to stand unsupported for 30 seconds Sitting unsupported: Numbers; 0-4: 4  4. Sits for 2 minutes independently  3. Sits for 2 minutes with supervision  2. Able to sit 30 seconds  1. Able to sit 10 seconds  0. Unable to sit for 10 seconds Standing to Sitting: Numbers; 0-4: 4 4. Sits safely with minimal use of hands 3. Controls descent with hands 2. Uses back of legs against chair to control descent 1. Sits independently, but uncontrolled descent 0. Needs assistance Transfers: Numbers; 0-4: 4  4. Transfers safely with minor use of hands  3. Transfers safely definite use of hands  2. Transfers with verbal cueing/supervision  1. Needs 1 person assist  0. Needs 2 person assist  Standing with eyes closed: Numbers; 0-4: 4  4. Stands safely for 10 seconds  3. Stands 10 seconds with  supervision   2. Able to stand for 3 seconds  1. Unable to keep eyes closed for 3 seconds, but is safe  0. Needs assist to keep from falling Standing with feet together: Numbers; 0-4: 4 4. Stands for 1 minute safely 3. Stands for 1 minute with supervision 2. Unable to hold for 30 seconds  1. Needs help to attain position but can hold for 15 seconds  0. Needs help to attain position and unable to hold for 15 seconds Reaching forward with outstretched arm: Numbers; 0-4: 4  4. Reaches forward 10 inches  3. Reaches forward 5 inches  2. Reaches forward 2 inches  1. Reaches forward with supervision  0. Loses balance/requires assistace Retrieving object from the floor: Numbers; 0-4: 4 4. Able to pick up easily and safely 3. Able to pick up with supervision 2. Unable to pick up, but reaches within 1-2 inches independently 1. Unable to pick up and needs supervision 0. Unable/needs assistance to keep from falling  Turning to look behind: Numbers; 0-4: 2  4. Looks behind from both sides and weight shifts well  3. Looks behind one side only, other side less weight  shift  2. Turns sideways only, maintains balance  1. Needs supervision when turning  0. Needs assistance  Turning 360 degrees: Numbers; 0-4: 4  4. Able to turn in </=4 seconds  3. Able to turn on one side in </= 4 seconds   2. Able to turn slowly, but safely  1. Needs supervision or verbal cueing  0. Needs assistance Place alternate foot on stool: Numbers; 0-4: 4 4. Completes 8 steps in 20 seconds 3. Completes 8 steps in >20 seconds 2. 4 steps without assistance/supervision 1. Completes >2 steps with minimal assist 0. Unable, needs assist to keep from falling Standing with one foot in front: Numbers; 0-4: 3  4. Independent tandem for 30 seconds  3. Independent foot ahead for 30 seconds  2. Independent small step for 30 seconds  1. Needs help to step, but can hold for 15 seconds  0. Loses balance while  standing/stepping Standing on one foot: Numbers; 0-4: 1 4. Holds >10 seconds 3. Holds 5-10 seconds 2. Holds >/=3 seconds  1. Holds <3 seconds 0. Unable   R , 2 sec  L  < 3 sec Total Score: 50/56   TODAY'S TREATMENT:  [facility] Adult PT Treatment:                                                DATE: 07-16-22 Therapeutic Exercise: STS to chair and no hands 1 x 10 STS 15 # KB 2 x 10  use of green t band for knees with good result Standing GTB with hip abd  BIL 2 x 10 R and L Standing GTB with hip ext BIL 2 x 10 R and L Levator scapulae stretch 1 x 30 sec on R and L Upper trap stretch 1 x 30 sec on R and L   Neuromuscular re-ed: SLS activity with UE counter support on floor SLS activity with UE counter support on there ex Reactive balance forward and  side ways and backward with PT. Pt with most difficulty with backward reactive balance Dual task balance task with colored disc for SL balance Dual task balance task with colored disc for squat and touch with UE  Cha Cha dancing with partner (PT) forward/backward  side to side  side to side with skip  [facility] Adult PT Treatment:                                                DATE: 07-09-22 Therapeutic Exercise: Supine chin tuck 1 x 10 holding 10 sec ea - verbal cues for proper from Upper trap stretch bil 1 x 30 sec  on R and LVC for amount of stretch to tightness but not pain  Levator scapulae stretch 1 x 30 sec on R and L Seated scapular retraction 2 x 10 - cues to avoid hiking the shoulders Standing Shoulder Diagonal Horizontal Abduction 60/120 Degrees with RTB  3 x 10 with VC for feet flat and not elevating upper traps Standing Shoulder Horizontal Abduction with  RTB  3x 10 with VC for feet flat and not elevating upper traps Sidelying Open Book Thoracic Lumbar Rotation and Extension   and taught standing due to pt nausea and did not want to lie down.  Single Leg Stance with Support  - Manual Therapy: STW along bil upper trap Cervical  distraction with rotation by PT  Pt became nauseated and PT stopped to address.  Pt nausea reduced and she was able to continue Sub-occipital release in sitting at end of session In supine, when PT performing head rolls in supine. Pt became nauseated. Pt sat up on edge of mat and advised to take deep breaths    Trigger Point Dry-Needling  Treatment instructions: Expect mild to moderate muscle soreness. S/S of pneumothorax if dry needled over a lung field, and to seek immediate medical attention should they occur. Patient verbalized understanding of these instructions and education. Pt needed to use massage chair in order to comfortably receive TPDN Patient Consent Given: Yes Education handout provided: Yes Muscles treated: bil upper trap  needles 30 x .34m Electrical stimulation performed: No Parameters: N/A Treatment response/outcome: twitch response noted, and pt tolerated treatment well. SELF CARE-  Pt given handout for body mechanics and posture and ADL's     Sleep postures for sidelying and on back.   Reviewed all information for ADL/ household chores Modalities - moist hot pack  [facility] Adult PT Treatment:                                                DATE: 07/02/2022 Therapeutic Exercise: Supine chin tuck 1 x 10 holding 5 sec ea - verbal cue sfor proper from Upper trap stretch bil 1 x 30 sec Levator scapulae stretch 1 x 30 sec Seated scapular retraction 2 x 10 - cues to avoid hiking the shoulders Manual Therapy: IASTM along bil upper trap Sub-occipital release   Trigger Point Dry-Needling  Treatment instructions: Expect mild to moderate muscle soreness. S/S of pneumothorax if dry needled over a lung field, and to seek immediate medical attention should they occur. Patient verbalized understanding of these instructions and education.  Patient Consent Given: Yes Education handout provided: Yes Muscles treated: bil upper trap Electrical stimulation performed: No Parameters:  N/A Treatment response/outcome: twitch response noted, and pt tolerated treatment well.  Therapeutic Activity: Bed mobility using log rolling technique to reduce quick motions and utilizing breathing while performing transfer   06-27-22  EVAL Issue exercise HEP, initial explanation of orthostatic hypotension.  Fall prevention  EVAL   PATIENT EDUCATION:  Education details: POC, Explanation of findings , initial HEP issued  FOTO done Person educated: Patient Education method: Explanation, Demonstration, Tactile cues, Verbal cues, and Handouts Education comprehension: verbalized understanding, returned demonstration, verbal cues required, tactile cues required, and needs further education   HOME EXERCISE PROGRAM:  Access Code: [email or web address]: [email or web address] Date: 07/16/2022 Prepared by: LVoncille Lo Exercises - Supine Deep Neck Flexor Training  - 2-3 x daily - 7 x weekly - 10 reps - 3 hold - Seated Cervical Retraction Protraction AROM  - 2-3 x daily - 7 x weekly - 1 sets - 10 reps - Seated Gentle Upper Trapezius Stretch  - 1 x daily - 7 x weekly - 1 sets - 3 reps - 30 hold - Gentle Levator Scapulae Stretch  - 1 x daily - 7 x weekly - 3 sets - 3 reps - 30 hold - Standing Shoulder Diagonal Horizontal Abduction 60/120 Degrees with Resistance  - 1 x daily - 7 x weekly - 3 sets - 10 reps - Standing Shoulder  Horizontal Abduction with Resistance  - 1 x daily - 7 x weekly - 3 sets - 10 reps - Sidelying Open Book Thoracic Lumbar Rotation and Extension  - 1 x daily - 7 x weekly - 3 sets - 10 reps - Single Leg Stance with Support  - 1 x daily - 7 x weekly - 1 sets - 3 reps - 30-45 sec  hold - Standing Hip Abduction with Resistance at Ankles and Counter Support  - 1 x daily - 7 x weekly - 3 sets - 10 reps - Standing Hip Extension with Resistance at Ankles and Counter Support  - 1 x daily - 7 x weekly - 3 sets - 10 reps ASSESSMENT:  CLINICAL IMPRESSION:  Mrs. Salmi  returns to clinic after vestibular  event after last clinic visit in which she was nauseated and had a headache after going home.  She returns today with all vestibular symptoms resolved.  Pt also with increased cervical rotation today at end of the visit. See flow chart. Session emphasis on reactive balance activities.  Pt with decreased backward reactive balance today.  HEP also modified today to include LE strength for unsteadiness on feet.  Will continue toward achievement of goals and better QOL  Mrs Fuhr arrives to her follow up session reporting 6/10 pain after working in gym.  Pt consented to TPDN and had no adverse effects after being montiored closely while pt on massage chair for positioning.  PT did perform STW and cervical distraction with rotation and pt became naseated.  PT reviewed her HEP and strength and balance added. . Time was taken to review her HEP which she needed verbal cueing and demonstration for. Pt also educated on posture and body mechanics and ADL/household chores. Pt will benefit from working on lifting principles next session for safety and decrease of hyperextension of neck. STG # 1 and 4 met.  Pt will continue until completion of goals.    OBJECTIVE IMPAIRMENTS decreased balance, decreased ROM, decreased strength, dizziness, and hypomobility.   ACTIVITY LIMITATIONS  turning head for driving and sleeping   PARTICIPATION LIMITATIONS: driving  [place] with hearing aids (very HOH), Anxiety/depression( well controlled, GERD,hypercholesteremia, obesity, OA, osteoporosis, sleep apnea with CPAP are also affecting patient's functional outcome.   REHAB POTENTIAL: Good  CLINICAL DECISION MAKING: Evolving/moderate complexity  EVALUATION COMPLEXITY: Moderate   GOALS: Goals reviewed with patient?  Partially will need to review 2nd visit  SHORT TERM GOALS: Target date: 07/30/2022   Pt will be independent with initial HEP Baseline: limited knowledge Goal  status: MET  2.  Pt will be educated on sleeping postures and position to improve sleep quality at night for more restorative sleep Baseline: unable to sleep on back due to neck pain Goal status:ONGOING  3.  Demonstrate understanding of proper sitting posture, body mechanics, work ergonomics, and be more conscious of position and posture throughout the day.  Baseline: forward head posture moderate to severe , 07-09-22 given handout on posture and body mechanics Goal status: ONGOING  4.  Report decreased pain by 25% in neck Baseline: 9/10 trying to turn neck, 07-09-22  at night 6/10 but in clinic 2/10 Goal status: MET   LONG TERM GOALS: Target date: 08/27/2022  Pt will be independent with  advanced HEP Baseline: limited knowledge Goal status: INITIAL  2.  Pt will be able to demonstrate getting up and down from the floor to decrease fear of falling Baseline: avoids falling Goal status: INITIAL  3.  Pt will improve BERG to at least 55/56 to show improved balance  Baseline: 50/56 BERG eval Goal status: INITIAL  4.  FOTO will improve from  44%  to   55%  indicating improved functional mobility  Baseline: 44% eval Goal status: INITIAL  5.  Improve UE MMT to at least 4 to 4+/5 to improve posture and ability to visually scan environment Baseline: 10 cm occiput to head measure  10 cm at risk for fall with forward head Goal status: INITIAL    PLAN: PT FREQUENCY: 1-2x/week  PT DURATION: 8 weeks  PLANNED INTERVENTIONS: Therapeutic exercises, Therapeutic activity, Neuromuscular re-education, Balance training, Gait training, Patient/Family education, Self Care, Joint mobilization, Dry Needling, Electrical stimulation, Spinal mobilization, Cryotherapy, Moist heat, Taping, Ionotophoresis 56m/ml Dexamethasone, Manual therapy, and Re-evaluationVestibular as needed.  PLAN FOR NEXT SESSION:  Pt went over body mechanics and posture and ADL  will need more demo for lifting and explanation   continue strength, balance and sit to stand for improvement of BERG    LVoncille Lo PT, ASouthwest Health Care Geropsych UnitCertified Exercise Expert for the Aging Adult  07/16/22 4:17 PM Phone: [phone]: [phone]

## 2022-07-16 NOTE — Patient Instructions (Signed)
Access Code: [email or web address] URL: [email or web address] Date: 07/16/2022 Prepared by: Voncille Lo  Exercises - Supine Deep Neck Flexor Training  - 2-3 x daily - 7 x weekly - 10 reps - 3 hold - Seated Cervical Retraction Protraction AROM  - 2-3 x daily - 7 x weekly - 1 sets - 10 reps - Seated Gentle Upper Trapezius Stretch  - 1 x daily - 7 x weekly - 1 sets - 3 reps - 30 hold - Gentle Levator Scapulae Stretch  - 1 x daily - 7 x weekly - 3 sets - 3 reps - 30 hold - Standing Shoulder Diagonal Horizontal Abduction 60/120 Degrees with Resistance  - 1 x daily - 7 x weekly - 3 sets - 10 reps - Standing Shoulder Horizontal Abduction with Resistance  - 1 x daily - 7 x weekly - 3 sets - 10 reps - Sidelying Open Book Thoracic Lumbar Rotation and Extension  - 1 x daily - 7 x weekly - 3 sets - 10 reps - Single Leg Stance with Support  - 1 x daily - 7 x weekly - 1 sets - 3 reps - 30-45 sec  hold - Standing Hip Abduction with Resistance at Ankles and Counter Support  - 1 x daily - 7 x weekly - 3 sets - 10 reps - Standing Hip Extension with Resistance at Ankles and Counter Support  - 1 x daily - 7 x weekly - 3 sets - 10 reps  Voncille Lo, PT, [place] Certified Exercise Expert for the Aging Adult  07/16/22 3:51 PM Phone: [phone] Fax: [phone]

## 2022-07-22 ENCOUNTER — Ambulatory Visit: Payer: Medicare PPO | Admitting: Physical Therapy

## 2022-07-22 ENCOUNTER — Encounter: Payer: Self-pay | Admitting: Physical Therapy

## 2022-07-22 DIAGNOSIS — R293 Abnormal posture: Secondary | ICD-10-CM

## 2022-07-22 DIAGNOSIS — R296 Repeated falls: Secondary | ICD-10-CM

## 2022-07-22 DIAGNOSIS — M542 Cervicalgia: Secondary | ICD-10-CM

## 2022-07-22 DIAGNOSIS — M6281 Muscle weakness (generalized): Secondary | ICD-10-CM

## 2022-07-22 NOTE — Therapy (Signed)
OUTPATIENT PHYSICAL THERAPY CERVICAL/BALANCE    Patient Name: Christie Harris MRN: [id number] DOB:July 22, 1941, 81 y.o., female Today's Date: 07/22/2022   PT End of Session - 07/22/22 1325     Visit Number 5    Number of Visits 16    Date for PT Re-Evaluation 08/22/22    Authorization Type [employer] [place] MEDICARE CHOICE PPO    Progress Note Due on Visit 10    PT Start Time 1325    PT Stop Time 1420    PT Time Calculation (min) 55 min    Activity Tolerance Patient tolerated treatment well    Behavior During Therapy WFL for tasks assessed/performed                 Past Medical History:  Diagnosis Date   Anxiety    Arthritis    greater left hip   CA of skin    squamous cell right leg-recent excision   Depression    Depression    GERD (gastroesophageal reflux disease)    Hypercholesteremia    Obesity    Osteoarthrosis    Osteoporosis    Shortness of breath    on exertion   Sleep apnea    CPAP at 13cm H2o   Sleep apnea    Past Surgical History:  Procedure Laterality Date   ABDOMINAL HYSTERECTOMY     uterus and ovaries   CATARACT EXTRACTION Right    CHOLECYSTECTOMY     COLONOSCOPY WITH PROPOFOL N/A 03/23/2013   Procedure: COLONOSCOPY WITH PROPOFOL;  Surgeon: Garlan Fair, MD;  Location: [facility];  Service: Endoscopy;  Laterality: N/A;colon polyps removed-benign   ESOPHAGOGASTRODUODENOSCOPY (EGD) WITH PROPOFOL N/A 06/16/2014   Procedure: ESOPHAGOGASTRODUODENOSCOPY (EGD) WITH PROPOFOL;  Surgeon: Garlan Fair, MD;  Location: [facility];  Service: Endoscopy;  Laterality: N/A;   plastic surgery on nose     "deviated septum and cosmetic"   Patient Active Problem List   Diagnosis Date Noted   OSA on CPAP 07/15/2013   Obesity (BMI 30-39.9) 07/15/2013   GERD (gastroesophageal reflux disease) 07/15/2013   Depression 07/15/2013    PCP: Seward Carol, MD  REFERRING PROVIDER: Shellia Carwin, MD  REFERRING DIAG:  M54.2 (ICD-10-CM) - Neck  pain  E11.42 (ICD-10-CM) - Diabetic polyneuropathy associated with type 2 diabetes mellitus ([place])  R26.81 (ICD-10-CM) - Gait instability  I95.1 (ICD-10-CM) - Orthostatic hypotension  I95.1 (ICD-10-CM) - Orthostatic intolerance    THERAPY DIAG:  No diagnosis found.  Rationale for Evaluation and Treatment Rehabilitation  ONSET DATE: neck pain  has been > 5 years,   balance problems 2 years or more  SUBJECTIVE:  SUBJECTIVE STATEMENT: " When I drive I notice my neck pain is there I am think it may contribut but I'm not sure."  PERTINENT HISTORY:  HOH with hearing aids (very HOH), Anxiety/depression( well controlled, GERD,hypercholesteremia, obesity, OA, osteoporosis, sleep apnea with CPAP  PAIN:   07-16-22   1/10 in neck and upper traps. 07-09-22    6/10 over bil upper traps and levator  07-09-22   Are you having pain? Yes: NPRS scale: 2/10 Pain location: back of neck up into head R >L Pain description: thorbbing, more dull pain Aggravating factors: Driving, stress, Relieving factors: lowering chin and looking down, taking tylenol  PRECAUTIONS: Fall  WEIGHT BEARING RESTRICTIONS No  FALLS:  Has patient fallen in last 6 months? Yes. Number of falls 2 times  once when I was [place] and then the following day I fell at my granddaughters house getting out of a air mattress between wall and bed  and had no room and I fell.  I feel unsteady when I feel dizzy or lightheaded  LIVING ENVIRONMENT: Lives with: lives with their family and lives with their daughter Lives in: House/apartment Stairs: Yes: Internal: 14 steps; on left going up and External: 1 steps; none Has following equipment at home:  built in bench in shower Pt room is on the first floor and she  avoids stairs OCCUPATION: retired  PLOF: Independent  PATIENT GOALS  Decrease neck pain,  decrease risk of falls, improve balance, drive with more comfort. Sleep more comfortably at night  OBJECTIVE:   DIAGNOSTIC FINDINGS:  IMPRESSION:04-29-22 No acute intracranial abnormality.  PATIENT SURVEYS:  FOTO 44%  55% predicted   COGNITION: Overall cognitive status: Within functional limits for tasks assessed   SENSATION: Pt reports tingling in bil  hands after waking up in the morning  POSTURE: rounded shoulders, forward head, and obesity  and mild thoracic kyphosis Occiput to wall  10 cm   10 cm at risk for falls PALPATION: TTP   CERVICAL ROM:   Active ROM A/PROM (deg) eval 07/16/2022  Flexion 30   Extension 33   Right lateral flexion 20   Left lateral flexion 15   Right rotation 20 pain 34 no pain  Left rotation 23 pain 30 no pain   (Blank rows = not tested)  UPPER EXTREMITY ROM:  Active ROM Right eval Left eval  Shoulder flexion 136/ P149 140/ P149  Shoulder extension    Shoulder abduction 130 134  Shoulder adduction    Shoulder extension    Shoulder internal rotation T-12 t_12  Shoulder external rotation C-7 C-6  Elbow flexion    Elbow extension    Wrist flexion    Wrist extension    Wrist ulnar deviation    Wrist radial deviation    Wrist pronation    Wrist supination     (Blank rows = not tested)  UPPER EXTREMITY MMT:  MMT Right eval Left eval  Shoulder flexion 5 5  Shoulder extension    Shoulder abduction 5 5  Shoulder adduction    Shoulder extension    Shoulder internal rotation 4 4  Shoulder external rotation 4 4  Middle trapezius 4- 4-  Lower trapezius 4- 4-  Elbow flexion    Elbow extension    Wrist flexion    Wrist extension    Wrist ulnar deviation    Wrist radial deviation    Wrist pronation    Wrist supination    Grip  strength 46lb 54 lb   (Blank rows = not tested)  CERVICAL SPECIAL TESTS:  Neck flexor muscle endurance  test: Positive, Spurling's test: Negative, and Distraction test: Negative  BP  142/82  pulse 82 FUNCTIONAL TESTS:  5 times sit to stand: 10.98 sec   13 sec or less no risk of fall 6 minute walk test: TBA Berg Balance Scale: 50/56 Total Score: 50/56   TODAY'S TREATMENT:  [facility] Adult PT Treatment:                                                DATE: 07/22/2022 Therapeutic Exercise: UBE L3 x 4 min (FWD/BWD x 2 min) Chin tuck head lift 10 x 10 sec hold Supine horizontal abduction 1 x 12 with RTB combined with sustained  chin tuck , 2nd set with GTB Supine bil scapular retraction with ER 2 x 12 with GTB Sit to stand with 1 x 5 with 2 sec hover, 1 x 5 with 4 second hover Manual Therapy: Trigger point release using theracane for bil upper trap/levator scapulae Sub-occipital release  Neuromuscular re-ed: Obstacle course with Airex pad, hurdle, 1/2 foam roll, hurdle and airex pad - hit hurdles multiple times when stepping over. 1st set standing on airex pads with 10 head turns, stepping over hurdles x 2 2nd set eyes closed on airex pads x 30 seconds stepping over hurdles and 1/2 foam roll x 2 3rd set marching in places on airex pads x 20, stepping over hurdles    [facility] Adult PT Treatment:                                                DATE: 07-16-22 Therapeutic Exercise: STS to chair and no hands 1 x 10 STS 15 # KB 2 x 10  use of green t band for knees with good result Standing GTB with hip abd  BIL 2 x 10 R and L Standing GTB with hip ext BIL 2 x 10 R and L Levator scapulae stretch 1 x 30 sec on R and L Upper trap stretch 1 x 30 sec on R and L   Neuromuscular re-ed: SLS activity with UE counter support on floor SLS activity with UE counter support on there ex Reactive balance forward and  side ways and backward with PT. Pt with most difficulty with backward reactive balance Dual task balance task with colored disc for SL balance Dual task balance task with colored disc for squat and  touch with UE  Cha Cha dancing with partner (PT) forward/backward  side to side  side to side with skip  [facility] Adult PT Treatment:                                                DATE: 07-09-22 Therapeutic Exercise: Supine chin tuck 1 x 10 holding 10 sec ea - verbal cues for proper from Upper trap stretch bil 1 x 30 sec  on R and LVC for amount of stretch to tightness but not pain  Levator scapulae stretch 1 x 30 sec on R and L  Seated scapular retraction 2 x 10 - cues to avoid hiking the shoulders Standing Shoulder Diagonal Horizontal Abduction 60/120 Degrees with RTB  3 x 10 with VC for feet flat and not elevating upper traps Standing Shoulder Horizontal Abduction with  RTB  3x 10 with VC for feet flat and not elevating upper traps Sidelying Open Book Thoracic Lumbar Rotation and Extension   and taught standing due to pt nausea and did not want to lie down. Single Leg Stance with Support  - Manual Therapy: STW along bil upper trap Cervical distraction with rotation by PT  Pt became nauseated and PT stopped to address.  Pt nausea reduced and she was able to continue Sub-occipital release in sitting at end of session In supine, when PT performing head rolls in supine. Pt became nauseated. Pt sat up on edge of mat and advised to take deep breaths    Trigger Point Dry-Needling  Treatment instructions: Expect mild to moderate muscle soreness. S/S of pneumothorax if dry needled over a lung field, and to seek immediate medical attention should they occur. Patient verbalized understanding of these instructions and education. Pt needed to use massage chair in order to comfortably receive TPDN Patient Consent Given: Yes Education handout provided: Yes Muscles treated: bil upper trap  needles 30 x .44m Electrical stimulation performed: No Parameters: N/A Treatment response/outcome: twitch response noted, and pt tolerated treatment well. SELF CARE-  Pt given handout for body mechanics and posture  and ADL's     Sleep postures for sidelying and on back.   Reviewed all information for ADL/ household chores Modalities - moist hot pack   PATIENT EDUCATION:  Education details: POC, Explanation of findings , initial HEP issued  FOTO done Person educated: Patient Education method: Explanation, Demonstration, Tactile cues, Verbal cues, and Handouts Education comprehension: verbalized understanding, returned demonstration, verbal cues required, tactile cues required, and needs further education   HOME EXERCISE PROGRAM:  Access Code: [email or web address]: [email or web address] Date: 07/16/2022 Prepared by: LVoncille Lo Exercises - Supine Deep Neck Flexor Training  - 2-3 x daily - 7 x weekly - 10 reps - 3 hold - Seated Cervical Retraction Protraction AROM  - 2-3 x daily - 7 x weekly - 1 sets - 10 reps - Seated Gentle Upper Trapezius Stretch  - 1 x daily - 7 x weekly - 1 sets - 3 reps - 30 hold - Gentle Levator Scapulae Stretch  - 1 x daily - 7 x weekly - 3 sets - 3 reps - 30 hold - Standing Shoulder Diagonal Horizontal Abduction 60/120 Degrees with Resistance  - 1 x daily - 7 x weekly - 3 sets - 10 reps - Standing Shoulder Horizontal Abduction with Resistance  - 1 x daily - 7 x weekly - 3 sets - 10 reps - Sidelying Open Book Thoracic Lumbar Rotation and Extension  - 1 x daily - 7 x weekly - 3 sets - 10 reps - Single Leg Stance with Support  - 1 x daily - 7 x weekly - 1 sets - 3 reps - 30-45 sec  hold - Standing Hip Abduction with Resistance at Ankles and Counter Support  - 1 x daily - 7 x weekly - 3 sets - 10 reps - Standing Hip Extension with Resistance at Ankles and Counter Support  - 1 x daily - 7 x weekly - 3 sets - 10 reps ASSESSMENT:  CLINICAL IMPRESSION: Mrs MHumphreysarrives to session with report of soreness that seems to occur  with driving but she is not completely sure. Time was taken to review self trigger point release and how to perform at home and tools that can  be used to use at home. Continued working on posterior shoulder and DNF activation progressing to chin tuck head lift. Continued working on Hotel manager and navigating obstacles which she demonstrated increased postural sway and hitting her foot on the hurdles. End of session she noted no pain    OBJECTIVE IMPAIRMENTS decreased balance, decreased ROM, decreased strength, dizziness, and hypomobility.   ACTIVITY LIMITATIONS  turning head for driving and sleeping   PARTICIPATION LIMITATIONS: driving  [place] with hearing aids (very HOH), Anxiety/depression( well controlled, GERD,hypercholesteremia, obesity, OA, osteoporosis, sleep apnea with CPAP are also affecting patient's functional outcome.   REHAB POTENTIAL: Good  CLINICAL DECISION MAKING: Evolving/moderate complexity  EVALUATION COMPLEXITY: Moderate   GOALS: Goals reviewed with patient?  Partially will need to review 2nd visit  SHORT TERM GOALS: Target date: 07/30/2022   Pt will be independent with initial HEP Baseline: limited knowledge Goal status: MET  2.  Pt will be educated on sleeping postures and position to improve sleep quality at night for more restorative sleep Baseline: unable to sleep on back due to neck pain Goal status:ONGOING  3.  Demonstrate understanding of proper sitting posture, body mechanics, work ergonomics, and be more conscious of position and posture throughout the day.  Baseline: forward head posture moderate to severe , 07-09-22 given handout on posture and body mechanics Goal status: ONGOING  4.  Report decreased pain by 25% in neck Baseline: 9/10 trying to turn neck, 07-09-22  at night 6/10 but in clinic 2/10 Goal status: MET   LONG TERM GOALS: Target date: 08/27/2022  Pt will be independent with  advanced HEP Baseline: limited knowledge Goal status: INITIAL  2.  Pt will be able to demonstrate getting up and down from the floor to decrease fear of falling Baseline:  avoids falling Goal status: INITIAL  3.  Pt will improve BERG to at least 55/56 to show improved balance  Baseline: 50/56 BERG eval Goal status: INITIAL  4.  FOTO will improve from  44%  to   55%  indicating improved functional mobility  Baseline: 44% eval Goal status: INITIAL  5.  Improve UE MMT to at least 4 to 4+/5 to improve posture and ability to visually scan environment Baseline: 10 cm occiput to head measure  10 cm at risk for fall with forward head Goal status: INITIAL    PLAN: PT FREQUENCY: 1-2x/week  PT DURATION: 8 weeks  PLANNED INTERVENTIONS: Therapeutic exercises, Therapeutic activity, Neuromuscular re-education, Balance training, Gait training, Patient/Family education, Self Care, Joint mobilization, Dry Needling, Electrical stimulation, Spinal mobilization, Cryotherapy, Moist heat, Taping, Ionotophoresis 87m/ml Dexamethasone, Manual therapy, and Re-evaluationVestibular as needed.  PLAN FOR NEXT SESSION:  Pt went over body mechanics and posture and ADL  will need more demo for lifting and explanation  continue strength, balance and sit to stand for improvement of BERG    Tynetta Bachmann PT, DPT, LAT, ATC  07/22/22  2:35 PM

## 2022-07-24 ENCOUNTER — Encounter: Payer: Self-pay | Admitting: Physical Therapy

## 2022-07-24 ENCOUNTER — Telehealth: Payer: Self-pay | Admitting: Pulmonary Disease

## 2022-07-24 ENCOUNTER — Ambulatory Visit: Payer: Medicare PPO | Admitting: Physical Therapy

## 2022-07-24 DIAGNOSIS — R293 Abnormal posture: Secondary | ICD-10-CM | POA: Diagnosis not present

## 2022-07-24 DIAGNOSIS — M542 Cervicalgia: Secondary | ICD-10-CM

## 2022-07-24 DIAGNOSIS — R296 Repeated falls: Secondary | ICD-10-CM

## 2022-07-24 DIAGNOSIS — M6281 Muscle weakness (generalized): Secondary | ICD-10-CM | POA: Diagnosis not present

## 2022-07-24 NOTE — Therapy (Signed)
OUTPATIENT PHYSICAL THERAPY CERVICAL/BALANCE    Patient Name: BLIMY NAPOLEON MRN: [id number] DOB:[birth year], 81 y.o., female Today's Date: 07/24/2022   PT End of Session - 07/24/22 1502     Visit Number 6    Number of Visits 16    Date for PT Re-Evaluation 08/22/22    Authorization Type [employer]IC[employer] CHOICE P[employer]    Progress Note Due on Visit 10    PT Start Time 1502    Activity Tolerance Patient tolerated treatment well                  Past Medical History:  Diagnosis Date   Anxiety    Arthritis    greater left hip   CA of skin    squamous cell right leg-recent excision   Depression    Depression    GERD (gastroesophageal reflux disease)    Hypercholesteremia    Obesity    Osteoarthrosis    Osteoporosis    Shortness of breath    on exertion   Sleep apnea    CPAP at 13cm H2o   Sleep apnea    Past Surgical History:  Procedure Laterality Date   ABDOMINAL HYSTERECTOMY     uterus and ovaries   CATARACT EXTRACTION Right    CHOLECYSTECTOMY     COLONOSCOPY WITH PROPOFOL N/A 03/23/2013   Procedure: COLONOSCOPY WITH PROPOFOL;  Surgeon: Garlan Fair, MD;  Location: [facility];  Service: Endoscopy;  Laterality: N/A;colon polyps removed-benign   ESOPHAGOGASTRODUODENOSCOPY (EGD) WITH PROPOFOL N/A 06/16/2014   Procedure: ESOPHAGOGASTRODUODENOSCOPY (EGD) WITH PROPOFOL;  Surgeon: Garlan Fair, MD;  Location: [facility];  Service: Endoscopy;  Laterality: N/A;   plastic surgery on nose     "deviated septum and cosmetic"   Patient Active Problem List   Diagnosis Date Noted   OSA on CPAP 07/15/2013   Obesity (BMI 30-39.9) 07/15/2013   GERD (gastroesophageal reflux disease) 07/15/2013   Depression 07/15/2013    PCP: Seward Carol, MD  REFERRING PROVIDER: Shellia Carwin, MD  REFERRING DIAG:  M54.2 (ICD-10-CM) - Neck pain  E11.42 (ICD-10-CM) - Diabetic polyneuropathy associated with type 2 diabetes mellitus ([place])  R26.81  (ICD-10-CM) - Gait instability  I95.1 (ICD-10-CM) - Orthostatic hypotension  I95.1 (ICD-10-CM) - Orthostatic intolerance    THERAPY DIAG:  Cervicalgia  Muscle weakness (generalized)  Repeated falls  Abnormal posture  Rationale for Evaluation and Treatment Rehabilitation  ONSET DATE: neck pain  has been > 5 years,   balance problems 2 years or more  SUBJECTIVE:  SUBJECTIVE STATEMENT: " I am noticing that I am having pain with driving that seems to go away once I get out of the car. I still get random bouts of dizziness I'm not sure why."  PERTINENT HISTORY:  HOH with hearing aids (very HOH), Anxiety/depression( well controlled, GERD,hypercholesteremia, obesity, OA, osteoporosis, sleep apnea with CPAP  PAIN:   07-16-22   1/10 in neck and upper traps. 07-09-22    6/10 over bil upper traps and levator  07-09-22   Are you having pain? Yes: NPRS scale: 2/10 Pain location: back of neck up into head R >L Pain description: thorbbing, more dull pain Aggravating factors: Driving, stress, Relieving factors: lowering chin and looking down, taking tylenol  PRECAUTIONS: Fall  WEIGHT BEARING RESTRICTIONS No  FALLS:  Has patient fallen in last 6 months? Yes. Number of falls 2 times  once when I was [place]pping [place] and then the following day I fell at my granddaughters house getting out of a air mattress between wall and bed  and had no room and I fell.  I feel unsteady when I feel dizzy or lightheaded  LIVING ENVIRONMENT: Lives with: lives with their family and lives with their daughter Lives in: House/apartment Stairs: Yes: Internal: 14 steps; on left going up and External: 1 steps; none Has following equipment at home:  built in bench in shower Pt room is on the  first floor and she avoids stairs OCCUPATION: retired  PLOF: Independent  PATIENT GOALS  Decrease neck pain,  decrease risk of falls, improve balance, drive with more comfort. Sleep more comfortably at night  OBJECTIVE:   DIAGNOSTIC FINDINGS:  IMPRESSION:04-29-22 No acute intracranial abnormality.  PATIENT SURVEYS:  FOTO 44%  55% predicted 07/24/2022 - 49%  COGNITION: Overall cognitive status: Within functional limits for tasks assessed   SENSATION: Pt reports tingling in bil  hands after waking up in the morning  POSTURE: rounded shoulders, forward head, and obesity  and mild thoracic kyphosis Occiput to wall  10 cm   10 cm at risk for falls PALPATION: TTP   CERVICAL ROM:   Active ROM A/PROM (deg) eval 07/16/2022  Flexion 30   Extension 33   Right lateral flexion 20   Left lateral flexion 15   Right rotation 20 pain 34 no pain  Left rotation 23 pain 30 no pain   (Blank rows = not tested)  UPPER EXTREMITY ROM:  Active ROM Right eval Left eval  Shoulder flexion 136/ P149 140/ P149  Shoulder extension    Shoulder abduction 130 134  Shoulder adduction    Shoulder extension    Shoulder internal rotation T-12 t_12  Shoulder external rotation C-7 C-6  Elbow flexion    Elbow extension    Wrist flexion    Wrist extension    Wrist ulnar deviation    Wrist radial deviation    Wrist pronation    Wrist supination     (Blank rows = not tested)  UPPER EXTREMITY MMT:  MMT Right eval Left eval  Shoulder flexion 5 5  Shoulder extension    Shoulder abduction 5 5  Shoulder adduction    Shoulder extension    Shoulder internal rotation 4 4  Shoulder external rotation 4 4  Middle trapezius 4- 4-  Lower trapezius 4- 4-  Elbow flexion    Elbow extension    Wrist flexion    Wrist extension    Wrist ulnar deviation    Wrist radial  deviation    Wrist pronation    Wrist supination    Grip strength 46lb 54 lb   (Blank rows = not tested)  CERVICAL SPECIAL  TESTS:  Neck flexor muscle endurance test: Positive, Spurling's test: Negative, and Distraction test: Negative  BP  142/82  pulse 82 FUNCTIONAL TESTS:  5 times sit to stand: 10.98 sec   13 sec or less no risk of fall 6 minute walk test: TBA Berg Balance Scale: 50/56 Total Score: 50/56   TODAY'S TREATMENT:  [facility] Adult PT Treatment:                                                DATE: 07/24/2022 Therapeutic Exercise: Supine scapular retraction with ER 2 x 12 with GTB Supine bil scapular retraction with ER 1 x 12 with GTB, diagonal 1 x 12 Seated Thoracic rotation hugging black physioball 2 x 10 Seated thoracic extension 2 x 10 Manual Therapy: Sub-occipital release Tack and stretch for sub-occipitals using tennis balls Therapeutic Activity: Log rolling to the L x 5, and to the R x 5 (verbal cues for proper form and technique to maximize ease Self Care: Reviewed posture in the car and modifed head rest to help promote proper positioning, and reviewed utilizing chin tuck    [facility] Adult PT Treatment:                                                DATE: 07/22/2022 Therapeutic Exercise: UBE L3 x 4 min (FWD/BWD x 2 min) Chin tuck head lift 10 x 10 sec hold Supine horizontal abduction 1 x 12 with RTB combined with sustained  chin tuck , 2nd set with GTB Supine bil scapular retraction with ER 2 x 12 with GTB Sit to stand with 1 x 5 with 2 sec hover, 1 x 5 with 4 second hover Manual Therapy: Trigger point release using theracane for bil upper trap/levator scapulae Sub-occipital release  Neuromuscular re-ed: Obstacle course with Airex pad, hurdle, 1/2 foam roll, hurdle and airex pad - hit hurdles multiple times when stepping over. 1st set standing on airex pads with 10 head turns, stepping over hurdles x 2 2nd set eyes closed on airex pads x 30 seconds stepping over hurdles and 1/2 foam roll x 2 3rd set marching in places on airex pads x 20, stepping over hurdles    [facility] Adult PT  Treatment:                                                DATE: 07-16-22 Therapeutic Exercise: STS to chair and no hands 1 x 10 STS 15 # KB 2 x 10  use of green t band for knees with good result Standing GTB with hip abd  BIL 2 x 10 R and L Standing GTB with hip ext BIL 2 x 10 R and L Levator scapulae stretch 1 x 30 sec on R and L Upper trap stretch 1 x 30 sec on R and L   Neuromuscular re-ed: SLS activity with UE counter support on floor SLS activity with UE  counter support on there ex Reactive balance forward and  side ways and backward with PT. Pt with most difficulty with backward reactive balance Dual task balance task with colored disc for SL balance Dual task balance task with colored disc for squat and touch with UE  Cha Cha dancing with partner (PT) forward/backward  side to side  side to side with skip   PATIENT EDUCATION:  Education details: POC, Explanation of findings , initial HEP issued  FOTO done Person educated: Patient Education method: Explanation, Demonstration, Tactile cues, Verbal cues, and Handouts Education comprehension: verbalized understanding, returned demonstration, verbal cues required, tactile cues required, and needs further education   HOME EXERCISE PROGRAM:  Access Code: [email or web address] URL: [email or web address] Date: 07/16/2022 Prepared by: Voncille Lo  Exercises - Supine Deep Neck Flexor Training  - 2-3 x daily - 7 x weekly - 10 reps - 3 hold - Seated Cervical Retraction Protraction AROM  - 2-3 x daily - 7 x weekly - 1 sets - 10 reps - Seated Gentle Upper Trapezius Stretch  - 1 x daily - 7 x weekly - 1 sets - 3 reps - 30 hold - Gentle Levator Scapulae Stretch  - 1 x daily - 7 x weekly - 3 sets - 3 reps - 30 hold - Standing Shoulder Diagonal Horizontal Abduction 60/120 Degrees with Resistance  - 1 x daily - 7 x weekly - 3 sets - 10 reps - Standing Shoulder Horizontal Abduction with Resistance  - 1 x daily - 7 x weekly - 3 sets -  10 reps - Sidelying Open Book Thoracic Lumbar Rotation and Extension  - 1 x daily - 7 x weekly - 3 sets - 10 reps - Single Leg Stance with Support  - 1 x daily - 7 x weekly - 1 sets - 3 reps - 30-45 sec  hold - Standing Hip Abduction with Resistance at Ankles and Counter Support  - 1 x daily - 7 x weekly - 3 sets - 10 reps - Standing Hip Extension with Resistance at Ankles and Counter Support  - 1 x daily - 7 x weekly - 3 sets - 10 reps ASSESSMENT:  CLINICAL IMPRESSION: Pt arrives to session reported continued pain in the back ofher head that occurs with driving. She notes she isn't fond of the head rest which may cause her to demonstrate an increase in [place]. Continued STW along the sub-occipitals. Continued working on posterior chain strengthening in combination with sub-occipital release. Reviewed car seat/ head rest position and modified and reviewed performing chin tuck. Pt noted she plans to trial using the head rest in a new position and report.    OBJECTIVE IMPAIRMENTS decreased balance, decreased ROM, decreased strength, dizziness, and hypomobility.   ACTIVITY LIMITATIONS  turning head for driving and sleeping   PARTICIPATION LIMITATIONS: driving  [place] with hearing aids (very HOH), Anxiety/depression( well controlled, GERD,hypercholesteremia, obesity, OA, osteoporosis, sleep apnea with CPAP are also affecting patient's functional outcome.   REHAB POTENTIAL: Good  CLINICAL DECISION MAKING: Evolving/moderate complexity  EVALUATION COMPLEXITY: Moderate   GOALS: Goals reviewed with patient?  Partially will need to review 2nd visit  SHORT TERM GOALS: Target date: 07/30/2022   Pt will be independent with initial HEP Baseline: limited knowledge Goal status: MET  2.  Pt will be educated on sleeping postures and position to improve sleep quality at night for more restorative sleep Baseline: unable to sleep on back due to neck pain Goal status:ONGOING  3.  Demonstrate understanding of proper sitting posture, body mechanics, work ergonomics, and be more conscious of position and posture throughout the day.  Baseline: forward head posture moderate to severe , 07-09-22 given handout on posture and body mechanics Goal status: ONGOING  4.  Report decreased pain by 25% in neck Baseline: 9/10 trying to turn neck, 07-09-22  at night 6/10 but in clinic 2/10 Goal status: MET   LONG TERM GOALS: Target date: 08/27/2022  Pt will be independent with  advanced HEP Baseline: limited knowledge Goal status: INITIAL  2.  Pt will be able to demonstrate getting up and down from the floor to decrease fear of falling Baseline: avoids falling Goal status: INITIAL  3.  Pt will improve BERG to at least 55/56 to show improved balance  Baseline: 50/56 BERG eval Goal status: INITIAL  4.  FOTO will improve from  44%  to   55%  indicating improved functional mobility  Baseline: 44% eval Goal status: INITIAL  5.  Improve UE MMT to at least 4 to 4+/5 to improve posture and ability to visually scan environment Baseline: 10 cm occiput to head measure  10 cm at risk for fall with forward head Goal status: INITIAL    PLAN: PT FREQUENCY: 1-2x/week  PT DURATION: 8 weeks  PLANNED INTERVENTIONS: Therapeutic exercises, Therapeutic activity, Neuromuscular re-education, Balance training, Gait training, Patient/Family education, Self Care, Joint mobilization, Dry Needling, Electrical stimulation, Spinal mobilization, Cryotherapy, Moist heat, Taping, Ionotophoresis 61m/ml Dexamethasone, Manual therapy, and Re-evaluationVestibular as needed.  PLAN FOR NEXT SESSION:  Pt went over body mechanics and posture and ADL  will need more demo for lifting and explanation  continue strength, balance and sit to stand for improvement of BERG    Salih Williamson PT, DPT, LAT, ATC  07/24/22  4:12 PM

## 2022-07-29 ENCOUNTER — Ambulatory Visit: Payer: Medicare PPO | Admitting: Physical Therapy

## 2022-07-29 DIAGNOSIS — R296 Repeated falls: Secondary | ICD-10-CM

## 2022-07-29 DIAGNOSIS — R293 Abnormal posture: Secondary | ICD-10-CM | POA: Diagnosis not present

## 2022-07-29 DIAGNOSIS — M542 Cervicalgia: Secondary | ICD-10-CM

## 2022-07-29 DIAGNOSIS — M6281 Muscle weakness (generalized): Secondary | ICD-10-CM

## 2022-07-29 NOTE — Therapy (Signed)
OUTPATIENT PHYSICAL THERAPY CERVICAL/BALANCE    Patient Name: Christie Harris MRN: [id number] DOB:[birth year], 81 y.o., female Today's Date: 07/29/2022   PT End of Session - 07/29/22 1504     Visit Number 7    Number of Visits 16    Date for PT Re-Evaluation 08/22/22    Progress Note Due on Visit 10    PT Start Time 1502    PT Stop Time 1547    PT Time Calculation (min) 45 min    Activity Tolerance Patient tolerated treatment well    Behavior During Therapy WFL for tasks assessed/performed                   Past Medical History:  Diagnosis Date   Anxiety    Arthritis    greater left hip   CA of skin    squamous cell right leg-recent excision   Depression    Depression    GERD (gastroesophageal reflux disease)    Hypercholesteremia    Obesity    Osteoarthrosis    Osteoporosis    Shortness of breath    on exertion   Sleep apnea    CPAP at 13cm H2o   Sleep apnea    Past Surgical History:  Procedure Laterality Date   ABDOMINAL HYSTERECTOMY     uterus and ovaries   CATARACT EXTRACTION Right    CHOLECYSTECTOMY     COLONOSCOPY WITH PROPOFOL N/A 03/23/2013   Procedure: COLONOSCOPY WITH PROPOFOL;  Surgeon: Garlan Fair, MD;  Location: [facility];  Service: Endoscopy;  Laterality: N/A;colon polyps removed-benign   ESOPHAGOGASTRODUODENOSCOPY (EGD) WITH PROPOFOL N/A 06/16/2014   Procedure: ESOPHAGOGASTRODUODENOSCOPY (EGD) WITH PROPOFOL;  Surgeon: Garlan Fair, MD;  Location: [facility];  Service: Endoscopy;  Laterality: N/A;   plastic surgery on nose     "deviated septum and cosmetic"   Patient Active Problem List   Diagnosis Date Noted   OSA on CPAP 07/15/2013   Obesity (BMI 30-39.9) 07/15/2013   GERD (gastroesophageal reflux disease) 07/15/2013   Depression 07/15/2013    PCP: Seward Carol, MD  REFERRING PROVIDER: Shellia Carwin, MD  REFERRING DIAG:  M54.2 (ICD-10-CM) - Neck pain  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 I get my theracane. My balance is getting better but I notice my biggest challenge being when I come down off the step."  PERTINENT HISTORY:  HOH with hearing aids (very HOH), Anxiety/depression( well controlled, GERD,hypercholesteremia, obesity, OA, osteoporosis, sleep apnea with CPAP  PAIN:   07-16-22   1/10 in neck and upper traps. 07-09-22    6/10 over bil upper traps and levator  07-09-22   Are you having pain? Yes: NPRS scale: 0/10 Pain location: back of neck up into head R >L Pain description: thorbbing, more dull pain Aggravating factors: Driving, stress, Relieving factors: lowering chin and looking down, taking tylenol  PRECAUTIONS: Fall  WEIGHT BEARING RESTRICTIONS No  FALLS:  Has patient fallen in last 6 months? Yes. Number of falls 2 times  once when I was [place] and then the following day I fell at my granddaughters house getting out of a air mattress between wall and bed  and had no room and I fell.  I feel unsteady when I feel dizzy or lightheaded  LIVING ENVIRONMENT: Lives with: lives with their family and lives with their daughter Lives in: House/apartment Stairs: Yes: Internal: 14 steps; on left going  up and External: 1 steps; none Has following equipment at home:  built in bench in shower Pt room is on the first floor and she avoids stairs OCCUPATION: retired  PLOF: Independent  PATIENT GOALS  Decrease neck pain,  decrease risk of falls, improve balance, drive with more comfort. Sleep more comfortably at night  OBJECTIVE:   DIAGNOSTIC FINDINGS:  IMPRESSION:04-29-22 No acute intracranial abnormality.  PATIENT SURVEYS:  FOTO 44%  55% predicted 07/24/2022 - 49%  COGNITION: Overall cognitive status: Within functional limits for tasks assessed   SENSATION: Pt reports tingling in bil  hands after waking up in the morning  POSTURE: rounded shoulders, forward head, and obesity  and mild thoracic kyphosis Occiput to wall  10 cm   10 cm at risk for falls PALPATION: TTP   CERVICAL ROM:   Active ROM A/PROM (deg) eval 07/16/2022  Flexion 30   Extension 33   Right lateral flexion 20   Left lateral flexion 15   Right rotation 20 pain 34 no pain  Left rotation 23 pain 30 no pain   (Blank rows = not tested)  UPPER EXTREMITY ROM:  Active ROM Right eval Left eval  Shoulder flexion 136/ P149 140/ P149  Shoulder extension    Shoulder abduction 130 134  Shoulder adduction    Shoulder extension    Shoulder internal rotation T-12 t_12  Shoulder external rotation C-7 C-6  Elbow flexion    Elbow extension    Wrist flexion    Wrist extension    Wrist ulnar deviation    Wrist radial deviation    Wrist pronation    Wrist supination     (Blank rows = not tested)  UPPER EXTREMITY MMT:  MMT Right eval Left eval  Shoulder flexion 5 5  Shoulder extension    Shoulder abduction 5 5  Shoulder adduction    Shoulder extension    Shoulder internal rotation 4 4  Shoulder external rotation 4 4  Middle trapezius 4- 4-  Lower trapezius 4- 4-  Elbow flexion    Elbow extension    Wrist flexion    Wrist extension  Wrist ulnar deviation    Wrist radial deviation    Wrist  pronation    Wrist supination    Grip strength 46lb 54 lb   (Blank rows = not tested)  CERVICAL SPECIAL TESTS:  Neck flexor muscle endurance test: Positive, Spurling's test: Negative, and Distraction test: Negative  BP  142/82  pulse 82 FUNCTIONAL TESTS:  5 times sit to stand: 10.98 sec   13 sec or less no risk of fall 6 minute walk test: TBA Berg Balance Scale: 50/56 Total Score: 50/56   TODAY'S TREATMENT:   [facility] Adult PT Treatment:                                                DATE: 07/29/2022 Therapeutic Exercise: Nu-Step L6 x 5 min LE only Step up/ down L 2 x 10 bil using 6 inch steps.  Neuromuscular re-ed: Standing on stable surface counter cone taps alternating Lt & Rt starting 1 ft away 1 x 20, 2 ft away 1 x 20, 3 ft way 1 x 20 - progressed to performed on airx pad Alternating cone taps with back leg on 2 inch step 2 x 20, and switch Modified tandem walking forward/ backward in // x 6 Static modified tandem postion with head turns 2 x 10 Therapeutic Activity: Navigating up/ down 6 inch steps x 6 , focusing on last step and foot placement to avoid hitting heel on the step   [facility] Adult PT Treatment:                                                DATE: 07/24/2022 Therapeutic Exercise: Supine scapular retraction with ER 2 x 12 with GTB Supine bil scapular retraction with ER 1 x 12 with GTB, diagonal 1 x 12 Seated Thoracic rotation hugging black physioball 2 x 10 Seated thoracic extension 2 x 10 Manual Therapy: Sub-occipital release Tack and stretch for sub-occipitals using tennis balls Therapeutic Activity: Log rolling to the L x 5, and to the R x 5 (verbal cues for proper form and technique to maximize ease Self Care: Reviewed posture in the car and modifed head rest to help promote proper positioning, and reviewed utilizing chin tuck    [facility] Adult PT Treatment:                                                DATE: 07/22/2022 Therapeutic Exercise: UBE L3 x 4 min  (FWD/BWD x 2 min) Chin tuck head lift 10 x 10 sec hold Supine horizontal abduction 1 x 12 with RTB combined with sustained  chin tuck , 2nd set with GTB Supine bil scapular retraction with ER 2 x 12 with GTB Sit to stand with 1 x 5 with 2 sec hover, 1 x 5 with 4 second hover Manual Therapy: Trigger point release using theracane for bil upper trap/levator scapulae Sub-occipital release  Neuromuscular re-ed: Obstacle course with Airex pad, hurdle, 1/2 foam roll, hurdle and airex pad - hit hurdles multiple times when stepping over. 1st set standing on airex pads with 10 head turns, stepping  over hurdles x 2 2nd set eyes closed on airex pads x 30 seconds stepping over hurdles and 1/2 foam roll x 2 3rd set marching in places on airex pads x 20, stepping over hurdles   PATIENT EDUCATION:  Education details: POC, Explanation of findings , initial HEP issued  FOTO done Person educated: Patient Education method: Explanation, Demonstration, Tactile cues, Verbal cues, and Handouts Education comprehension: verbalized understanding, returned demonstration, verbal cues required, tactile cues required, and needs further education   HOME EXERCISE PROGRAM:  Access Code: [email or web address] URL: [email or web address] Date: 07/16/2022 Prepared by: Voncille Lo  Exercises - Supine Deep Neck Flexor Training  - 2-3 x daily - 7 x weekly - 10 reps - 3 hold - Seated Cervical Retraction Protraction AROM  - 2-3 x daily - 7 x weekly - 1 sets - 10 reps - Seated Gentle Upper Trapezius Stretch  - 1 x daily - 7 x weekly - 1 sets - 3 reps - 30 hold - Gentle Levator Scapulae Stretch  - 1 x daily - 7 x weekly - 3 sets - 3 reps - 30 hold - Standing Shoulder Diagonal Horizontal Abduction 60/120 Degrees with Resistance  - 1 x daily - 7 x weekly - 3 sets - 10 reps - Standing Shoulder Horizontal Abduction with Resistance  - 1 x daily - 7 x weekly - 3 sets - 10 reps - Sidelying Open Book Thoracic Lumbar Rotation  and Extension  - 1 x daily - 7 x weekly - 3 sets - 10 reps - Single Leg Stance with Support  - 1 x daily - 7 x weekly - 1 sets - 3 reps - 30-45 sec  hold - Standing Hip Abduction with Resistance at Ankles and Counter Support  - 1 x daily - 7 x weekly - 3 sets - 10 reps - Standing Hip Extension with Resistance at Ankles and Counter Support  - 1 x daily - 7 x weekly - 3 sets - 10 reps ASSESSMENT:  CLINICAL IMPRESSION: Pt arrives to session noting she feels that the head rest re-positioning has improved but she hasn't driven much since the last session. Due to no pain in the neck today focused on balance training especially on the bottom step due to pt reported issues with losing her balance with stepping down. She did well with balance training on stable and unstable surfaces in rhomberg and split stance with back leg on step. Worked on stepping down and foot placement to reduce tripping hazard on stairs. End of session she noted feeling tired but overall good.    OBJECTIVE IMPAIRMENTS decreased balance, decreased ROM, decreased strength, dizziness, and hypomobility.   ACTIVITY LIMITATIONS  turning head for driving and sleeping   PARTICIPATION LIMITATIONS: driving  [place] with hearing aids (very HOH), Anxiety/depression( well controlled, GERD,hypercholesteremia, obesity, OA, osteoporosis, sleep apnea with CPAP are also affecting patient's functional outcome.   REHAB POTENTIAL: Good  CLINICAL DECISION MAKING: Evolving/moderate complexity  EVALUATION COMPLEXITY: Moderate   GOALS: Goals reviewed with patient?  Partially will need to review 2nd visit  SHORT TERM GOALS: Target date: 07/30/2022   Pt will be independent with initial HEP Baseline: limited knowledge Goal status: MET  2.  Pt will be educated on sleeping postures and position to improve sleep quality at night for more restorative sleep Baseline: unable to sleep on back due to neck pain Goal status:ONGOING  3.   Demonstrate understanding of proper sitting posture, body mechanics, work ergonomics, and be more  conscious of position and posture throughout the day.  Baseline: forward head posture moderate to severe , 07-09-22 given handout on posture and body mechanics Goal status: ONGOING  4.  Report decreased pain by 25% in neck Baseline: 9/10 trying to turn neck, 07-09-22  at night 6/10 but in clinic 2/10 Goal status: MET   LONG TERM GOALS: Target date: 08/27/2022  Pt will be independent with  advanced HEP Baseline: limited knowledge Goal status: INITIAL  2.  Pt will be able to demonstrate getting up and down from the floor to decrease fear of falling Baseline: avoids falling Goal status: INITIAL  3.  Pt will improve BERG to at least 55/56 to show improved balance  Baseline: 50/56 BERG eval Goal status: INITIAL  4.  FOTO will improve from  44%  to   55%  indicating improved functional mobility  Baseline: 44% eval Goal status: INITIAL  5.  Improve UE MMT to at least 4 to 4+/5 to improve posture and ability to visually scan environment Baseline: 10 cm occiput to head measure  10 cm at risk for fall with forward head Goal status: INITIAL    PLAN: PT FREQUENCY: 1-2x/week  PT DURATION: 8 weeks  PLANNED INTERVENTIONS: Therapeutic exercises, Therapeutic activity, Neuromuscular re-education, Balance training, Gait training, Patient/Family education, Self Care, Joint mobilization, Dry Needling, Electrical stimulation, Spinal mobilization, Cryotherapy, Moist heat, Taping, Ionotophoresis 8m/ml Dexamethasone, Manual therapy, and Re-evaluationVestibular as needed.  PLAN FOR NEXT SESSION:  Pt went over body mechanics and posture and ADL  will need more demo for lifting and explanation  continue strength, balance and sit to stand for improvement of BERG    Ashrita Chrismer PT, DPT, LAT, ATC  07/29/22  4:00 PM

## 2022-07-31 ENCOUNTER — Ambulatory Visit: Payer: Medicare PPO | Attending: Neurology | Admitting: Physical Therapy

## 2022-07-31 ENCOUNTER — Encounter: Payer: Self-pay | Admitting: Physical Therapy

## 2022-07-31 DIAGNOSIS — R293 Abnormal posture: Secondary | ICD-10-CM | POA: Diagnosis not present

## 2022-07-31 DIAGNOSIS — M542 Cervicalgia: Secondary | ICD-10-CM | POA: Diagnosis not present

## 2022-07-31 DIAGNOSIS — R296 Repeated falls: Secondary | ICD-10-CM | POA: Insufficient documentation

## 2022-07-31 DIAGNOSIS — M6281 Muscle weakness (generalized): Secondary | ICD-10-CM | POA: Insufficient documentation

## 2022-07-31 NOTE — Therapy (Signed)
OUTPATIENT PHYSICAL THERAPY CERVICAL/BALANCE    Patient Name: Christie Harris MRN: [id number] DOB:January 10, 1941, 81 y.o., female Today's Date: 07/31/2022   PT End of Session - 07/31/22 1505     Visit Number 8    Number of Visits 16    Date for PT Re-Evaluation 08/22/22    Authorization Type [employer] [place] [employer]ICARE CHOICE PPO    Progress Note Due on Visit 10    PT Start Time 1504    PT Stop Time 1547    PT Time Calculation (min) 43 min    Activity Tolerance Patient tolerated treatment well    Behavior During Therapy WFL for tasks assessed/performed                    Past Medical History:  Diagnosis Date   Anxiety    Arthritis    greater left hip   CA of skin    squamous cell right leg-recent excision   Depression    Depression    GERD (gastroesophageal reflux disease)    Hypercholesteremia    Obesity    Osteoarthrosis    Osteoporosis    Shortness of breath    on exertion   Sleep apnea    CPAP at 13cm H2o   Sleep apnea    Past Surgical History:  Procedure Laterality Date   ABDOMINAL HYSTERECTOMY     uterus and ovaries   CATARACT EXTRACTION Right    CHOLECYSTECTOMY     COLONOSCOPY WITH PROPOFOL N/A 03/23/2013   Procedure: COLONOSCOPY WITH PROPOFOL;  Surgeon: Garlan Fair, MD;  Location: [facility];  Service: Endoscopy;  Laterality: N/A;colon polyps removed-benign   ESOPHAGOGASTRODUODENOSCOPY (EGD) WITH PROPOFOL N/A 06/16/2014   Procedure: ESOPHAGOGASTRODUODENOSCOPY (EGD) WITH PROPOFOL;  Surgeon: Garlan Fair, MD;  Location: [facility];  Service: Endoscopy;  Laterality: N/A;   plastic surgery on nose     "deviated septum and cosmetic"   Patient Active Problem List   Diagnosis Date Noted   OSA on CPAP 07/15/2013   Obesity (BMI 30-39.9) 07/15/2013   GERD (gastroesophageal reflux disease) 07/15/2013   Depression 07/15/2013    PCP: Seward Carol, MD  REFERRING PROVIDER: Shellia Carwin, MD  REFERRING DIAG:  M54.2 (ICD-10-CM) -  Neck pain  E11.42 (ICD-10-CM) - Diabetic polyneuropathy associated with type 2 diabetes mellitus ([place])  R26.81 (ICD-10-CM) - Gait instability  I95.1 (ICD-10-CM) - Orthostatic hypotension  I95.1 (ICD-10-CM) - Orthostatic intolerance    THERAPY DIAG:  Cervicalgia  Muscle weakness (generalized)  Repeated falls  Abnormal posture  Rationale for Evaluation and Treatment Rehabilitation  ONSET DATE: neck pain  has been > 5 years,   balance problems 2 years or more  SUBJECTIVE:  SUBJECTIVE STATEMENT: " I noticed I had no pain with watching TV. I am feeling alittle fatigued today as a result of doing a lot and getting up early."  PERTINENT HISTORY:  HOH with hearing aids (very HOH), Anxiety/depression( well controlled, GERD,hypercholesteremia, obesity, OA, osteoporosis, sleep apnea with CPAP  PAIN:   07-16-22   1/10 in neck and upper traps. 07-09-22    6/10 over bil upper traps and levator  07-09-22   Are you having pain? Yes: NPRS scale: 0/10 Pain location: back of neck up into head R >L Pain description: thorbbing, more dull pain Aggravating factors: Driving, stress, Relieving factors: lowering chin and looking down, taking tylenol  PRECAUTIONS: Fall  WEIGHT BEARING RESTRICTIONS No  FALLS:  Has patient fallen in last 6 months? Yes. Number of falls 2 times  once when I was [place] and then the following day I fell at my granddaughters house getting out of a air mattress between wall and bed  and had no room and I fell.  I feel unsteady when I feel dizzy or lightheaded  LIVING ENVIRONMENT: Lives with: lives with their family and lives with their daughter Lives in: House/apartment Stairs: Yes: Internal: 14 steps; on left going up and External: 1  steps; none Has following equipment at home:  built in bench in shower Pt room is on the first floor and she avoids stairs OCCUPATION: retired  PLOF: Independent  PATIENT GOALS  Decrease neck pain,  decrease risk of falls, improve balance, drive with more comfort. Sleep more comfortably at night  OBJECTIVE:   DIAGNOSTIC FINDINGS:  IMPRESSION:04-29-22 No acute intracranial abnormality.  PATIENT SURVEYS:  FOTO 44%  55% predicted 07/24/2022 - 49%  COGNITION: Overall cognitive status: Within functional limits for tasks assessed   SENSATION: Pt reports tingling in bil  hands after waking up in the morning  POSTURE: rounded shoulders, forward head, and obesity  and mild thoracic kyphosis Occiput to wall  10 cm   10 cm at risk for falls PALPATION: TTP   CERVICAL ROM:   Active ROM A/PROM (deg) eval 07/16/2022  Flexion 30   Extension 33   Right lateral flexion 20   Left lateral flexion 15   Right rotation 20 pain 34 no pain  Left rotation 23 pain 30 no pain   (Blank rows = not tested)  UPPER EXTREMITY ROM:  Active ROM Right eval Left eval  Shoulder flexion 136/ P149 140/ P149  Shoulder extension    Shoulder abduction 130 134  Shoulder adduction    Shoulder extension    Shoulder internal rotation T-12 t_12  Shoulder external rotation C-7 C-6  Elbow flexion    Elbow extension    Wrist flexion    Wrist extension    Wrist ulnar deviation    Wrist radial deviation    Wrist pronation    Wrist supination     (Blank rows = not tested)  UPPER EXTREMITY MMT:  MMT Right eval Left eval  Shoulder flexion 5 5  Shoulder extension    Shoulder abduction 5 5  Shoulder adduction    Shoulder extension    Shoulder internal rotation 4 4  Shoulder external rotation 4 4  Middle trapezius 4- 4-  Lower trapezius 4- 4-  Elbow flexion    Elbow extension    Wrist flexion    Wrist extension    Wrist ulnar deviation    Wrist radial deviation    Wrist pronation  Wrist  supination    Grip strength 46lb 54 lb   (Blank rows = not tested)  CERVICAL SPECIAL TESTS:  Neck flexor muscle endurance test: Positive, Spurling's test: Negative, and Distraction test: Negative  BP  142/82  pulse 82 FUNCTIONAL TESTS:  5 times sit to stand: 10.98 sec   13 sec or less no risk of fall 6 minute walk test: TBA Berg Balance Scale: 50/56 Total Score: 50/56   TODAY'S TREATMENT:   [facility] Adult PT Treatment:                                                DATE: 07/31/2022 Therapeutic Exercise: Nu-step L 5 x 5 min UE/LE Upper trap stretch 2 x 30 sec bil Foam roll routine 2 x 10 ea: Ceiling punches, horizontal ab/adduction, alternating ceiling punches, x to y, and back stroke Manual Therapy: Sub-occipital release Upper trap MTPR x2 ea.    [facility] Adult PT Treatment:                                                DATE: 07/29/2022 Therapeutic Exercise: Nu-Step L6 x 5 min LE only Step up/ down L 2 x 10 bil using 6 inch steps.  Neuromuscular re-ed: Standing on stable surface counter cone taps alternating Lt & Rt starting 1 ft away 1 x 20, 2 ft away 1 x 20, 3 ft way 1 x 20 - progressed to performed on airx pad Alternating cone taps with back leg on 2 inch step 2 x 20, and switch Modified tandem walking forward/ backward in // x 6 Static modified tandem postion with head turns 2 x 10 Therapeutic Activity: Navigating up/ down 6 inch steps x 6 , focusing on last step and foot placement to avoid hitting heel on the step   [facility] Adult PT Treatment:                                                DATE: 07/24/2022 Therapeutic Exercise: Supine scapular retraction with ER 2 x 12 with GTB Supine bil scapular retraction with ER 1 x 12 with GTB, diagonal 1 x 12 Seated Thoracic rotation hugging black physioball 2 x 10 Seated thoracic extension 2 x 10 Manual Therapy: Sub-occipital release Tack and stretch for sub-occipitals using tennis balls Therapeutic Activity: Log rolling to the L x  5, and to the R x 5 (verbal cues for proper form and technique to maximize ease Self Care: Reviewed posture in the car and modifed head rest to help promote proper positioning, and reviewed utilizing chin tuck     PATIENT EDUCATION:  Education details: POC, Explanation of findings , initial HEP issued  FOTO done Person educated: Patient Education method: Explanation, Demonstration, Tactile cues, Verbal cues, and Handouts Education comprehension: verbalized understanding, returned demonstration, verbal cues required, tactile cues required, and needs further education   HOME EXERCISE PROGRAM:  Access Code: [email or web address] URL: [email or web address] Date: 07/31/2022 Prepared by: Starr Lake  Program Notes with the foam roll exercises you can use 2 towels rolled up, or 1 beach towels.   Exercises -  Supine Deep Neck Flexor Training  - 2-3 x daily - 7 x weekly - 10 reps - 3 hold - Seated Cervical Retraction Protraction AROM  - 2-3 x daily - 7 x weekly - 1 sets - 10 reps - Seated Gentle Upper Trapezius Stretch  - 1 x daily - 7 x weekly - 1 sets - 3 reps - 30 hold - Gentle Levator Scapulae Stretch  - 1 x daily - 7 x weekly - 3 sets - 3 reps - 30 hold - Standing Shoulder Diagonal Horizontal Abduction 60/120 Degrees with Resistance  - 1 x daily - 7 x weekly - 3 sets - 10 reps - Standing Shoulder Horizontal Abduction with Resistance  - 1 x daily - 7 x weekly - 3 sets - 10 reps - Sidelying Open Book Thoracic Lumbar Rotation and Extension  - 1 x daily - 7 x weekly - 3 sets - 10 reps - Single Leg Stance with Support  - 1 x daily - 7 x weekly - 1 sets - 3 reps - 30-45 sec  hold - Standing Hip Abduction with Resistance at Ankles and Counter Support  - 1 x daily - 7 x weekly - 3 sets - 10 reps - Standing Hip Extension with Resistance at Ankles and Counter Support  - 1 x daily - 7 x weekly - 3 sets - 10 reps - Supine on Foam Roll Reach and Roll  - 1 x daily - 7 x weekly - 10 reps - 5  seconds hold - [place] on [employer]  - 1 x daily - 7 x weekly - 10 reps - Thoracic Y on Foam Roll  - 1 x daily - 7 x weekly - 1 reps - 20-30 seconds hold - Supine Static Chest Stretch on Foam Roll  - 1 x daily - 7 x weekly - 1 reps - 20-30 seconds hold - Hooklying Scapular Protraction on Foam Roll  - 1 x daily - 7 x weekly - 10 reps ASSESSMENT:  CLINICAL IMPRESSION: Pt arrives to session reporting feeling alittle more sore in the neck but also fatigue as a result of doing more activity today. Continued STW along the sub-occipitals and upper traps followed with a stretch and scapulothoracic strengthening in supine doing foam roll series with rolled towels. Time was taken to address patient questions. Plan to resume doing balance training next session as tolerated.    OBJECTIVE IMPAIRMENTS decreased balance, decreased ROM, decreased strength, dizziness, and hypomobility.   ACTIVITY LIMITATIONS  turning head for driving and sleeping   PARTICIPATION LIMITATIONS: driving  [place] with hearing aids (very HOH), Anxiety/depression( well controlled, GERD,hypercholesteremia, obesity, OA, osteoporosis, sleep apnea with CPAP are also affecting patient's functional outcome.   REHAB POTENTIAL: Good  CLINICAL DECISION MAKING: Evolving/moderate complexity  EVALUATION COMPLEXITY: Moderate   GOALS: Goals reviewed with patient?  Partially will need to review 2nd visit  SHORT TERM GOALS: Target date: 07/30/2022   Pt will be independent with initial HEP Baseline: limited knowledge Goal status: MET  2.  Pt will be educated on sleeping postures and position to improve sleep quality at night for more restorative sleep Baseline: unable to sleep on back due to neck pain Goal status:ONGOING  3.  Demonstrate understanding of proper sitting posture, body mechanics, work ergonomics, and be more conscious of position and posture throughout the day.  Baseline: forward head posture moderate  to severe , 07-09-22 given handout on posture and body mechanics Goal status: ONGOING  4.  Report decreased pain by 25% in neck Baseline: 9/10 trying to turn neck, 07-09-22  at night 6/10 but in clinic 2/10 Goal status: MET   LONG TERM GOALS: Target date: 08/27/2022  Pt will be independent with  advanced HEP Baseline: limited knowledge Goal status: INITIAL  2.  Pt will be able to demonstrate getting up and down from the floor to decrease fear of falling Baseline: avoids falling Goal status: INITIAL  3.  Pt will improve BERG to at least 55/56 to show improved balance  Baseline: 50/56 BERG eval Goal status: INITIAL  4.  FOTO will improve from  44%  to   55%  indicating improved functional mobility  Baseline: 44% eval Goal status: INITIAL  5.  Improve UE MMT to at least 4 to 4+/5 to improve posture and ability to visually scan environment Baseline: 10 cm occiput to head measure  10 cm at risk for fall with forward head Goal status: INITIAL    PLAN: PT FREQUENCY: 1-2x/week  PT DURATION: 8 weeks  PLANNED INTERVENTIONS: Therapeutic exercises, Therapeutic activity, Neuromuscular re-education, Balance training, Gait training, Patient/Family education, Self Care, Joint mobilization, Dry Needling, Electrical stimulation, Spinal mobilization, Cryotherapy, Moist heat, Taping, Ionotophoresis 18m/ml Dexamethasone, Manual therapy, and Re-evaluationVestibular as needed.  PLAN FOR NEXT SESSION:  Pt went over body mechanics and posture and ADL  will need more demo for lifting and explanation  continue strength, balance and sit to stand for improvement of BERG    Keivon Garden PT, DPT, LAT, ATC  07/31/22  4:26 PM

## 2022-08-05 DIAGNOSIS — M8588 Other specified disorders of bone density and structure, other site: Secondary | ICD-10-CM | POA: Diagnosis not present

## 2022-08-05 DIAGNOSIS — E1169 Type 2 diabetes mellitus with other specified complication: Secondary | ICD-10-CM | POA: Diagnosis not present

## 2022-08-05 DIAGNOSIS — Z0000 Encounter for general adult medical examination without abnormal findings: Secondary | ICD-10-CM | POA: Diagnosis not present

## 2022-08-05 DIAGNOSIS — E7800 Pure hypercholesterolemia, unspecified: Secondary | ICD-10-CM | POA: Diagnosis not present

## 2022-08-05 DIAGNOSIS — F324 Major depressive disorder, single episode, in partial remission: Secondary | ICD-10-CM | POA: Diagnosis not present

## 2022-08-06 ENCOUNTER — Other Ambulatory Visit: Payer: Self-pay

## 2022-08-06 ENCOUNTER — Encounter: Payer: Self-pay | Admitting: Physical Therapy

## 2022-08-06 ENCOUNTER — Ambulatory Visit: Payer: Medicare PPO | Admitting: Physical Therapy

## 2022-08-06 DIAGNOSIS — R296 Repeated falls: Secondary | ICD-10-CM | POA: Diagnosis not present

## 2022-08-06 DIAGNOSIS — M6281 Muscle weakness (generalized): Secondary | ICD-10-CM

## 2022-08-06 DIAGNOSIS — R293 Abnormal posture: Secondary | ICD-10-CM

## 2022-08-06 DIAGNOSIS — M542 Cervicalgia: Secondary | ICD-10-CM

## 2022-08-06 NOTE — Therapy (Signed)
OUTPATIENT PHYSICAL THERAPY CERVICAL/BALANCE    Patient Name: Christie Harris MRN: [id number] DOB:1940-10-18, 81 y.o., female Today's Date: 08/06/2022   PT End of Session - 08/06/22 1555     Visit Number 9    Number of Visits 16    Date for PT Re-Evaluation 08/22/22    Authorization Type [employer] [place] MEDICARE CHOICE PPO    Progress Note Due on Visit 10    PT Start Time 1553    PT Stop Time 1645    PT Time Calculation (min) 52 min    Activity Tolerance Patient tolerated treatment well    Behavior During Therapy WFL for tasks assessed/performed                     Past Medical History:  Diagnosis Date   Anxiety    Arthritis    greater left hip   CA of skin    squamous cell right leg-recent excision   Depression    Depression    GERD (gastroesophageal reflux disease)    Hypercholesteremia    Obesity    Osteoarthrosis    Osteoporosis    Shortness of breath    on exertion   Sleep apnea    CPAP at 13cm H2o   Sleep apnea    Past Surgical History:  Procedure Laterality Date   ABDOMINAL HYSTERECTOMY     uterus and ovaries   CATARACT EXTRACTION Right    CHOLECYSTECTOMY     COLONOSCOPY WITH PROPOFOL N/A 03/23/2013   Procedure: COLONOSCOPY WITH PROPOFOL;  Surgeon: Garlan Fair, MD;  Location: [facility];  Service: Endoscopy;  Laterality: N/A;colon polyps removed-benign   ESOPHAGOGASTRODUODENOSCOPY (EGD) WITH PROPOFOL N/A 06/16/2014   Procedure: ESOPHAGOGASTRODUODENOSCOPY (EGD) WITH PROPOFOL;  Surgeon: Garlan Fair, MD;  Location: [facility];  Service: Endoscopy;  Laterality: N/A;   plastic surgery on nose     "deviated septum and cosmetic"   Patient Active Problem List   Diagnosis Date Noted   OSA on CPAP 07/15/2013   Obesity (BMI 30-39.9) 07/15/2013   GERD (gastroesophageal reflux disease) 07/15/2013   Depression 07/15/2013    PCP: Seward Carol, MD  REFERRING PROVIDER: Shellia Carwin, MD  REFERRING DIAG:  M54.2 (ICD-10-CM)  - Neck pain  E11.42 (ICD-10-CM) - Diabetic polyneuropathy associated with type 2 diabetes mellitus ([place])  R26.81 (ICD-10-CM) - Gait instability  I95.1 (ICD-10-CM) - Orthostatic hypotension  I95.1 (ICD-10-CM) - Orthostatic intolerance    THERAPY DIAG:  Cervicalgia  Muscle weakness (generalized)  Repeated falls  Abnormal posture  Rationale for Evaluation and Treatment Rehabilitation  ONSET DATE: neck pain  has been > 5 years,   balance problems 2 years or more  SUBJECTIVE:  SUBJECTIVE STATEMENT: I just came home from the gym today.  PERTINENT HISTORY:  HOH with hearing aids (very HOH), Anxiety/depression( well controlled, GERD,hypercholesteremia, obesity, OA, osteoporosis, sleep apnea with CPAP  PAIN:  08-06-22   I have no pain 07-16-22   1/10 in neck and upper traps. 07-09-22    6/10 over bil upper traps and levator  07-09-22   Are you having pain? Yes: NPRS scale: 0/10 Pain location: back of neck up into head R >L Pain description: thorbbing, more dull pain Aggravating factors: Driving, stress, Relieving factors: lowering chin and looking down, taking tylenol  PRECAUTIONS: Fall  WEIGHT BEARING RESTRICTIONS No  FALLS:  Has patient fallen in last 6 months? Yes. Number of falls 2 times  once when I was [place] and then the following day I fell at my granddaughters house getting out of a air mattress between wall and bed  and had no room and I fell.  I feel unsteady when I feel dizzy or lightheaded  LIVING ENVIRONMENT: Lives with: lives with their family and lives with their daughter Lives in: House/apartment Stairs: Yes: Internal: 14 steps; on left going up and External: 1 steps; none Has following equipment at home:  built in bench in  shower Pt room is on the first floor and she avoids stairs OCCUPATION: retired  PLOF: Independent  PATIENT GOALS  Decrease neck pain,  decrease risk of falls, improve balance, drive with more comfort. Sleep more comfortably at night  OBJECTIVE:   DIAGNOSTIC FINDINGS:  IMPRESSION:04-29-22 No acute intracranial abnormality.  PATIENT SURVEYS:  FOTO 44%  55% predicted 07/24/2022 - 49%  COGNITION: Overall cognitive status: Within functional limits for tasks assessed   SENSATION: Pt reports tingling in bil  hands after waking up in the morning  POSTURE: rounded shoulders, forward head, and obesity  and mild thoracic kyphosis Occiput to wall  10 cm   10 cm at risk for falls PALPATION: TTP   CERVICAL ROM:   Active ROM A/PROM (deg) eval 07/16/2022  Flexion 30   Extension 33   Right lateral flexion 20   Left lateral flexion 15   Right rotation 20 pain 34 no pain  Left rotation 23 pain 30 no pain   (Blank rows = not tested)  UPPER EXTREMITY ROM:  Active ROM Right eval Left eval  Shoulder flexion 136/ P149 140/ P149  Shoulder extension    Shoulder abduction 130 134  Shoulder adduction    Shoulder extension    Shoulder internal rotation T-12 t_12  Shoulder external rotation C-7 C-6  Elbow flexion    Elbow extension    Wrist flexion    Wrist extension    Wrist ulnar deviation    Wrist radial deviation    Wrist pronation    Wrist supination     (Blank rows = not tested)  UPPER EXTREMITY MMT:  MMT Right eval Left eval  Shoulder flexion 5 5  Shoulder extension    Shoulder abduction 5 5  Shoulder adduction    Shoulder extension    Shoulder internal rotation 4 4  Shoulder external rotation 4 4  Middle trapezius 4- 4-  Lower trapezius 4- 4-  Elbow flexion    Elbow extension    Wrist flexion    Wrist extension    Wrist ulnar deviation    Wrist radial deviation    Wrist pronation    Wrist supination    Grip strength 46lb 54 lb   (  Blank rows = not  tested)  CERVICAL SPECIAL TESTS:  Neck flexor muscle endurance test: Positive, Spurling's test: Negative, and Distraction test: Negative  BP  142/82  pulse 82 FUNCTIONAL TESTS:  5 times sit to stand: 10.98 sec   13 sec or less no risk of fall 6 minute walk test: TBA Berg Balance Scale: 50/56 Total Score: 50/56   TODAY'S TREATMENT:  [facility] Adult PT Treatment:                                                DATE: 08-06-22 Therapeutic Exercise: supervision for correct execution by PT  Nu-step L 5 x 5 min UE/LE 15 # KB swing 3 x 8 with 90 sec to 2 min rest between sets SL hip flexion over KB side march with  slow and controlled descent with ER Alternating steps on 6 inch step x 20 each R and L Double tap on alternating steps on 6 inch step x 20 on R and L Step ups on 6 inch step Neck lateral and posterior neck isometric with ball on wall  Neuromuscular re-ed:  Close supervision by PT with all below Cha Cha dancing steps forward and side stepping Cha Cha dancing steps forward and side stepping with skipping [place] Single limb tapping with dual task primary color circles Lunge to primary color circles on R LE leading and the with L LE leading Side stepping over cones for clearance of feet with high stepping hip flexion  [facility] Adult PT Treatment:                                                DATE: 07/31/2022 Therapeutic Exercise: Nu-step L 5 x 5 min UE/LE Upper trap stretch 2 x 30 sec bil Foam roll routine 2 x 10 ea: Ceiling punches, horizontal ab/adduction, alternating ceiling punches, x to y, and back stroke Manual Therapy: Sub-occipital release Upper trap MTPR x2 ea.    [facility] Adult PT Treatment:                                                DATE: 07/29/2022 Therapeutic Exercise: Nu-Step L6 x 5 min LE only Step up/ down L 2 x 10 bil using 6 inch steps.  Neuromuscular re-ed: Standing on stable surface counter cone taps alternating Lt & Rt starting 1 ft away 1 x 20, 2 ft away 1  x 20, 3 ft way 1 x 20 - progressed to performed on airx pad Alternating cone taps with back leg on 2 inch step 2 x 20, and switch Modified tandem walking forward/ backward in // x 6 Static modified tandem postion with head turns 2 x 10 Therapeutic Activity: Navigating up/ down 6 inch steps x 6 , focusing on last step and foot placement to avoid hitting heel on the step   [facility] Adult PT Treatment:  DATE: 07/24/2022 Therapeutic Exercise: Supine scapular retraction with ER 2 x 12 with GTB Supine bil scapular retraction with ER 1 x 12 with GTB, diagonal 1 x 12 Seated Thoracic rotation hugging black physioball 2 x 10 Seated thoracic extension 2 x 10 Manual Therapy: Sub-occipital release Tack and stretch for sub-occipitals using tennis balls Therapeutic Activity: Log rolling to the L x 5, and to the R x 5 (verbal cues for proper form and technique to maximize ease Self Care: Reviewed posture in the car and modifed head rest to help promote proper positioning, and reviewed utilizing chin tuck     PATIENT EDUCATION:  Education details: POC, Explanation of findings , initial HEP issued  FOTO done Person educated: Patient Education method: Explanation, Demonstration, Tactile cues, Verbal cues, and Handouts Education comprehension: verbalized understanding, returned demonstration, verbal cues required, tactile cues required, and needs further education   HOME EXERCISE PROGRAM:  Access Code: [email or web address] URL: [email or web address] Date: 07/31/2022 Prepared by: Starr Lake  Program Notes with the foam roll exercises you can use 2 towels rolled up, or 1 beach towels.   Exercises - Supine Deep Neck Flexor Training  - 2-3 x daily - 7 x weekly - 10 reps - 3 hold - Seated Cervical Retraction Protraction AROM  - 2-3 x daily - 7 x weekly - 1 sets - 10 reps - Seated Gentle Upper Trapezius Stretch  - 1 x daily - 7 x weekly - 1 sets -  3 reps - 30 hold - Gentle Levator Scapulae Stretch  - 1 x daily - 7 x weekly - 3 sets - 3 reps - 30 hold - Standing Shoulder Diagonal Horizontal Abduction 60/120 Degrees with Resistance  - 1 x daily - 7 x weekly - 3 sets - 10 reps - Standing Shoulder Horizontal Abduction with Resistance  - 1 x daily - 7 x weekly - 3 sets - 10 reps - Sidelying Open Book Thoracic Lumbar Rotation and Extension  - 1 x daily - 7 x weekly - 3 sets - 10 reps - Single Leg Stance with Support  - 1 x daily - 7 x weekly - 1 sets - 3 reps - 30-45 sec  hold - Standing Hip Abduction with Resistance at Ankles and Counter Support  - 1 x daily - 7 x weekly - 3 sets - 10 reps - Standing Hip Extension with Resistance at Ankles and Counter Support  - 1 x daily - 7 x weekly - 3 sets - 10 reps - Supine on Foam Roll Reach and Roll  - 1 x daily - 7 x weekly - 10 reps - 5 seconds hold - [place] on Foam Roll  - 1 x daily - 7 x weekly - 10 reps - Thoracic Y on Foam Roll  - 1 x daily - 7 x weekly - 1 reps - 20-30 seconds hold - Supine Static Chest Stretch on Foam Roll  - 1 x daily - 7 x weekly - 1 reps - 20-30 seconds hold - Hooklying Scapular Protraction on Foam Roll  - 1 x daily - 7 x weekly - 10 reps ASSESSMENT:  CLINICAL IMPRESSION: Pt arrives to session after working out at gym.  With no pain.  Pt has no neck pain and session emphasis on LE strength and SL and balance activities.  Pt is feeling more confidant of stepping and strengthening.    Next session will have 10th progress note update and Goals to be assessed.  Will continue to  maximize balance and strength for rest of sessions in next two visits.    OBJECTIVE IMPAIRMENTS decreased balance, decreased ROM, decreased strength, dizziness, and hypomobility.   ACTIVITY LIMITATIONS  turning head for driving and sleeping   PARTICIPATION LIMITATIONS: driving  [place] with hearing aids (very HOH), Anxiety/depression( well controlled, GERD,hypercholesteremia, obesity,  OA, osteoporosis, sleep apnea with CPAP are also affecting patient's functional outcome.   REHAB POTENTIAL: Good  CLINICAL DECISION MAKING: Evolving/moderate complexity  EVALUATION COMPLEXITY: Moderate   GOALS: Goals reviewed with patient?  Partially will need to review 2nd visit  SHORT TERM GOALS: Target date: 07/30/2022   Pt will be independent with initial HEP Baseline: limited knowledge Goal status: MET  2.  Pt will be educated on sleeping postures and position to improve sleep quality at night for more restorative sleep Baseline: unable to sleep on back due to neck pain Goal status:ONGOING  3.  Demonstrate understanding of proper sitting posture, body mechanics, work ergonomics, and be more conscious of position and posture throughout the day.  Baseline: forward head posture moderate to severe , 07-09-22 given handout on posture and body mechanics Goal status: ONGOING  4.  Report decreased pain by 25% in neck Baseline: 9/10 trying to turn neck, 07-09-22  at night 6/10 but in clinic 2/10 Goal status: MET   LONG TERM GOALS: Target date: 08/27/2022  Pt will be independent with  advanced HEP Baseline: limited knowledge Goal status: INITIAL  2.  Pt will be able to demonstrate getting up and down from the floor to decrease fear of falling Baseline: avoids falling Goal status: INITIAL  3.  Pt will improve BERG to at least 55/56 to show improved balance  Baseline: 50/56 BERG eval Goal status: INITIAL  4.  FOTO will improve from  44%  to   55%  indicating improved functional mobility  Baseline: 44% eval Goal status: INITIAL  5.  Improve UE MMT to at least 4 to 4+/5 to improve posture and ability to visually scan environment Baseline: 10 cm occiput to head measure  10 cm at risk for fall with forward head Goal status: INITIAL    PLAN: PT FREQUENCY: 1-2x/week  PT DURATION: 8 weeks  PLANNED INTERVENTIONS: Therapeutic exercises, Therapeutic activity, Neuromuscular  re-education, Balance training, Gait training, Patient/Family education, Self Care, Joint mobilization, Dry Needling, Electrical stimulation, Spinal mobilization, Cryotherapy, Moist heat, Taping, Ionotophoresis 6m/ml Dexamethasone, Manual therapy, and Re-evaluationVestibular as needed.  PLAN FOR NEXT SESSION:  Pt went over body mechanics and posture and ADL  will need more demo for lifting and explanation  continue strength, balance and sit to stand for improvement of BERG    LVoncille Lo PT, AGreat Lakes Surgical Suites LLC Dba Great Lakes Surgical SuitesCertified Exercise Expert for the Aging Adult  08/06/22 4:51 PM Phone: [phone]: [phone]

## 2022-08-09 NOTE — Telephone Encounter (Signed)
Will close encounter

## 2022-08-13 ENCOUNTER — Encounter: Payer: Self-pay | Admitting: Physical Therapy

## 2022-08-13 ENCOUNTER — Ambulatory Visit: Payer: Medicare PPO | Admitting: Physical Therapy

## 2022-08-13 DIAGNOSIS — R296 Repeated falls: Secondary | ICD-10-CM

## 2022-08-13 DIAGNOSIS — M6281 Muscle weakness (generalized): Secondary | ICD-10-CM

## 2022-08-13 DIAGNOSIS — M542 Cervicalgia: Secondary | ICD-10-CM | POA: Diagnosis not present

## 2022-08-13 DIAGNOSIS — R293 Abnormal posture: Secondary | ICD-10-CM

## 2022-08-13 NOTE — Therapy (Signed)
OUTPATIENT PHYSICAL THERAPY CERVICAL/BALANCE /PROGRESS NOTE  Progress Note Reporting Period 06-27-22 to 08-13-22  See note below for Objective Data and Assessment of Progress/Goals.     Patient Name: Christie Harris MRN: [id number] DOB:07-22-1941, 81 y.o., female Today's Date: 08/13/2022   PT End of Session - 08/13/22 1543     Visit Number 10    Number of Visits 16    Date for PT Re-Evaluation 08/22/22    Authorization Type E-HU[employer] MEDICARE/[employer] MEDICARE CHOICE PPO    PT Start Time 1545    PT Stop Time 1630    PT Time Calculation (min) 45 min    Activity Tolerance Patient tolerated treatment well    Behavior During Therapy WFL for tasks assessed/performed                      Past Medical History:  Diagnosis Date   Anxiety    Arthritis    greater left hip   CA of skin    squamous cell right leg-recent excision   Depression    Depression    GERD (gastroesophageal reflux disease)    Hypercholesteremia    Obesity    Osteoarthrosis    Osteoporosis    Shortness of breath    on exertion   Sleep apnea    CPAP at 13cm H2o   Sleep apnea    Past Surgical History:  Procedure Laterality Date   ABDOMINAL HYSTERECTOMY     uterus and ovaries   CATARACT EXTRACTION Right    CHOLECYSTECTOMY     COLONOSCOPY WITH PROPOFOL N/A 03/23/2013   Procedure: COLONOSCOPY WITH PROPOFOL;  Surgeon: Garlan Fair, MD;  Location: [facility];  Service: Endoscopy;  Laterality: N/A;colon polyps removed-benign   ESOPHAGOGASTRODUODENOSCOPY (EGD) WITH PROPOFOL N/A 06/16/2014   Procedure: ESOPHAGOGASTRODUODENOSCOPY (EGD) WITH PROPOFOL;  Surgeon: Garlan Fair, MD;  Location: [facility];  Service: Endoscopy;  Laterality: N/A;   plastic surgery on nose     "deviated septum and cosmetic"   Patient Active Problem List   Diagnosis Date Noted   OSA on CPAP 07/15/2013   Obesity (BMI 30-39.9) 07/15/2013   GERD (gastroesophageal reflux disease) 07/15/2013   Depression 07/15/2013     PCP: Seward Carol, MD  REFERRING PROVIDER: Shellia Carwin, MD  REFERRING DIAG:  M54.2 (ICD-10-CM) - Neck pain  E11.42 (ICD-10-CM) - Diabetic polyneuropathy associated with type 2 diabetes mellitus ([place])  R26.81 (ICD-10-CM) - Gait instability  I95.1 (ICD-10-CM) - Orthostatic hypotension  I95.1 (ICD-10-CM) - Orthostatic intolerance    THERAPY DIAG:  Cervicalgia  Muscle weakness (generalized)  Repeated falls  Abnormal posture  Rationale for Evaluation and Treatment Rehabilitation  ONSET DATE: neck pain  has been > 5 years,   balance problems 2 years or more  SUBJECTIVE:  SUBJECTIVE STATEMENT: I just came from gym today. I moved two trash bins to the  curb.  I also did it for my neighbors.    PERTINENT HISTORY:  HOH with hearing aids (very HOH), Anxiety/depression( well controlled, GERD,hypercholesteremia, obesity, OA, osteoporosis, sleep apnea with CPAP  PAIN:  08-13-22   0/10 pain 08-06-22   I have no pain 07-16-22   1/10 in neck and upper traps. 07-09-22    6/10 over bil upper traps and levator  07-09-22   Are you having pain? Yes: NPRS scale: 0/10 Pain location: back of neck up into head R >L Pain description: thorbbing, more dull pain Aggravating factors: Driving, stress, Relieving factors: lowering chin and looking down, taking tylenol  PRECAUTIONS: Fall  WEIGHT BEARING RESTRICTIONS No  FALLS:  Has patient fallen in last 6 months? Yes. Number of falls 2 times  once when I [place] and then the following day I fell at my granddaughters house getting out of a air mattress between wall and bed  and had no room and I fell.  I feel unsteady when I feel dizzy or lightheaded  LIVING ENVIRONMENT: Lives with: lives with their family  and lives with their daughter Lives in: House/apartment Stairs: Yes: Internal: 14 steps; on left going up and External: 1 steps; none Has following equipment at home:  built in bench in shower Pt room is on the first floor and she avoids stairs OCCUPATION: retired  PLOF: Independent  PATIENT GOALS  Decrease neck pain,  decrease risk of falls, improve balance, drive with more comfort. Sleep more comfortably at night  OBJECTIVE:   DIAGNOSTIC FINDINGS:  IMPRESSION:04-29-22 No acute intracranial abnormality.  PATIENT SURVEYS:  FOTO 44%  55% predicted 07/24/2022 - 49% 08-13-22  COGNITION: Overall cognitive status: Within functional limits for tasks assessed   SENSATION: Pt reports tingling in bil  hands after waking up in the morning  POSTURE: rounded shoulders, forward head, and obesity  and mild thoracic kyphosis Occiput to wall  10 cm   10 cm at risk for falls PALPATION: TTP   CERVICAL ROM:   Active ROM A/PROM (deg) eval 07/16/2022 08-13-22  Flexion 30  44  Extension 33  50  Right lateral flexion 20  34  Left lateral flexion 15  29  Right rotation 20 pain 34 no pain 45  Left rotation 23 pain 30 no pain 50   (Blank rows = not tested)  UPPER EXTREMITY ROM:  Active ROM Right eval Left eval RT/LT 08-13-22  Shoulder flexion 136/ P149 140/ P149 149/139  Shoulder extension     Shoulder abduction 130 134   Shoulder adduction     Shoulder extension     Shoulder internal rotation T-12 t_12   Shoulder external rotation C-7 C-6   Elbow flexion     Elbow extension     Wrist flexion     Wrist extension     Wrist ulnar deviation     Wrist radial deviation     Wrist pronation     Wrist supination      (Blank rows = not tested)  UPPER EXTREMITY MMT:  MMT Right eval Left eval  Shoulder flexion 5 5  Shoulder extension    Shoulder abduction 5 5  Shoulder adduction    Shoulder extension    Shoulder internal rotation 4 4  Shoulder external rotation 4 4  Middle  trapezius 4- 4-  Lower trapezius 4- 4-  Elbow flexion    Elbow extension    Wrist flexion    Wrist extension    Wrist ulnar deviation    Wrist radial deviation    Wrist pronation    Wrist supination    Grip strength 46lb 54 lb   (Blank rows = not tested)  CERVICAL SPECIAL TESTS:  Neck flexor muscle endurance test: Positive, Spurling's test: Negative, and Distraction test: Negative  BP  142/82  pulse 82 FUNCTIONAL TESTS:  5 times sit to stand: 10.98 sec   13 sec or less no risk of fall 6 minute walk test: TBA Berg Balance Scale: 50/56 Total Score: 50/56   TODAY'S TREATMENT:  [facility] Adult PT Treatment:                                                DATE: 08-13-22 FOTO  75%  met goal Therapeutic Exercise: Freemotion, triceps pull with 13# Freemotion alernating UE RT with LT LE forward and vice versa   2 x 10 with 13 # Worlds greatest stretch Farmers carry wit 45# KB for 10 feet 25 # KB overhead Press  1 x 10 and 1 x 8 Yoga block knee squeeze with prayer position thoracic stretch.    Modified thoracic stretch in Runner's stretch position.    Self Care: Sleep Hygiene. Lifting principles and demo ADL. Posture/body mechanics [facility] Adult PT Treatment:                                                DATE: 08-06-22 Therapeutic Exercise: supervision for correct execution by PT  Nu-step L 5 x 5 min UE/LE 15 # KB swing 3 x 8 with 90 sec to 2 min rest between sets SL hip flexion over KB side march with  slow and controlled descent with ER Alternating steps on 6 inch step x 20 each R and L Double tap on alternating steps on 6 inch step x 20 on R and L Step ups on 6 inch step Neck lateral and posterior neck isometric with ball on wall  Neuromuscular re-ed:  Close supervision by PT with all below Cha Cha dancing steps forward and side stepping Cha Cha dancing steps forward and side stepping with skipping [place] Single limb tapping with dual task primary color circles Lunge to  primary color circles on R LE leading and the with L LE leading Side stepping over cones for clearance of feet with high stepping hip flexion  [facility] Adult PT Treatment:                                                DATE: 07/31/2022 Therapeutic Exercise: Nu-step L 5 x 5 min UE/LE Upper trap stretch 2 x 30 sec bil Foam roll routine 2 x 10 ea: Ceiling punches, horizontal ab/adduction, alternating ceiling punches, x to y, and back stroke Manual Therapy: Sub-occipital release Upper trap MTPR x2 ea.    [facility] Adult PT Treatment:  DATE: 07/29/2022 Therapeutic Exercise: Nu-Step L6 x 5 min LE only Step up/ down L 2 x 10 bil using 6 inch steps.  Neuromuscular re-ed: Standing on stable surface counter cone taps alternating Lt & Rt starting 1 ft away 1 x 20, 2 ft away 1 x 20, 3 ft way 1 x 20 - progressed to performed on airx pad Alternating cone taps with back leg on 2 inch step 2 x 20, and switch Modified tandem walking forward/ backward in // x 6 Static modified tandem postion with head turns 2 x 10 Therapeutic Activity: Navigating up/ down 6 inch steps x 6 , focusing on last step and foot placement to avoid hitting heel on the step   [facility] Adult PT Treatment:                                                DATE: 07/24/2022 Therapeutic Exercise: Supine scapular retraction with ER 2 x 12 with GTB Supine bil scapular retraction with ER 1 x 12 with GTB, diagonal 1 x 12 Seated Thoracic rotation hugging black physioball 2 x 10 Seated thoracic extension 2 x 10 Manual Therapy: Sub-occipital release Tack and stretch for sub-occipitals using tennis balls Therapeutic Activity: Log rolling to the L x 5, and to the R x 5 (verbal cues for proper form and technique to maximize ease Self Care: Reviewed posture in the car and modifed head rest to help promote proper positioning, and reviewed utilizing chin tuck     PATIENT EDUCATION:  Education details: POC,  Explanation of findings , initial HEP issued  FOTO done Person educated: Patient Education method: Explanation, Demonstration, Tactile cues, Verbal cues, and Handouts Education comprehension: verbalized understanding, returned demonstration, verbal cues required, tactile cues required, and needs further education   HOME EXERCISE PROGRAM:  Access Code: [email or web address] URL: [email or web address] Date: 08/13/2022 Prepared by: Voncille Lo  Program Notes with the foam roll exercises you can use 2 towels rolled up, or 1 beach towels.   Exercises - Supine Deep Neck Flexor Training  - 2-3 x daily - 7 x weekly - 10 reps - 3 hold - Seated Cervical Retraction Protraction AROM  - 2-3 x daily - 7 x weekly - 1 sets - 10 reps - Seated Gentle Upper Trapezius Stretch  - 1 x daily - 7 x weekly - 1 sets - 3 reps - 30 hold - Gentle Levator Scapulae Stretch  - 1 x daily - 7 x weekly - 3 sets - 3 reps - 30 hold - Standing Shoulder Diagonal Horizontal Abduction 60/120 Degrees with Resistance  - 1 x daily - 7 x weekly - 3 sets - 10 reps - Standing Shoulder Horizontal Abduction with Resistance  - 1 x daily - 7 x weekly - 3 sets - 10 reps - Sidelying Open Book Thoracic Lumbar Rotation and Extension  - 1 x daily - 7 x weekly - 3 sets - 10 reps - Single Leg Stance with Support  - 1 x daily - 7 x weekly - 1 sets - 3 reps - 30-45 sec  hold - Standing Hip Abduction with Resistance at Ankles and Counter Support  - 1 x daily - 7 x weekly - 1 sets - 2-3 reps - Standing Hip Extension with Resistance at Ankles and Counter Support  - 1 x daily - 7 x weekly - 3  sets - 10 reps - Supine on Foam Roll Reach and Roll  - 1 x daily - 7 x weekly - 10 reps - 5 seconds hold - [place] on [employer]  - 1 x daily - 7 x weekly - 10 reps - Thoracic Y on Foam Roll  - 1 x daily - 7 x weekly - 1 reps - 20-30 seconds hold - Supine Static Chest Stretch on Foam Roll  - 1 x daily - 7 x weekly - 1 reps - 20-30 seconds hold -  Hooklying Scapular Protraction on Foam Roll  - 1 x daily - 7 x weekly - 10 reps - Seated Assisted Cervical Rotation with Towel  - 1 x daily - 7 x weekly - 3 sets - 10 reps ASSESSMENT:  CLINICAL IMPRESSION: Ms Rodd arrives after working out at [employer] improved to 75%. Pt AROM of cervical motion improved.  See flow chart.  Pt arrives to session after working out at gym.  Pt with no pain and improved motion of neck. Session concentrates on dynamic stretching, and strengthening and emphasis on sleep hygiene/ADL body mechanics for lifting.  Pt is on target to DC after next visit . Pt with all STG achieved and on course to complete all goals at next visit DC.  Pt met LTG# 4. Pt also able to lift 25 # KB above head and carry 45# for 10 feet with some trunk lean compensation.  Will complete rest of goals next session   OBJECTIVE IMPAIRMENTS decreased balance, decreased ROM, decreased strength, dizziness, and hypomobility.   ACTIVITY LIMITATIONS  turning head for driving and sleeping   PARTICIPATION LIMITATIONS: driving  [place] with hearing aids (very HOH), Anxiety/depression( well controlled, GERD,hypercholesteremia, obesity, OA, osteoporosis, sleep apnea with CPAP are also affecting patient's functional outcome.   REHAB POTENTIAL: Good  CLINICAL DECISION MAKING: Evolving/moderate complexity  EVALUATION COMPLEXITY: Moderate   GOALS: Goals reviewed with patient?  Partially will need to review 2nd visit  SHORT TERM GOALS: Target date: 07/30/2022   Pt will be independent with initial HEP Baseline: limited knowledge Goal status: MET  2.  Pt will be educated on sleeping postures and position to improve sleep quality at night for more restorative sleep Baseline: unable to sleep on back due to neck pain Goal status:  MET  3.  Demonstrate understanding of proper sitting posture, body mechanics, work ergonomics, and be more conscious of position and posture throughout the day.   Baseline: forward head posture moderate to severe , 07-09-22 given handout on posture and body mechanics Goal status: MET  4.  Report decreased pain by 25% in neck Baseline: 9/10 trying to turn neck, 07-09-22  at night 6/10 but in clinic 2/10 Goal status: MET   LONG TERM GOALS: Target date: 08/27/2022  Pt will be independent with  advanced HEP Baseline: limited knowledge Goal status:ONGOING  2.  Pt will be able to demonstrate getting up and down from the floor to decrease fear of falling Baseline: avoids falling Goal status: ONGOING  3.  Pt will improve BERG to at least 55/56 to show improved balance  Baseline: 50/56 BERG eval Goal status: ONGOING  4.  FOTO will improve from  44%  to   55%  indicating improved functional mobility  Baseline: 44% eval 08-13-22  75% Goal status: MET  5.  Improve UE MMT to at least 4 to 4+/5 to improve posture and ability to visually scan environment Baseline: 10 cm occiput  to head measure  10 cm at risk for fall with forward head 11-14 -23  Pt able to lift BIL 25 lb above head Goal status: ONGOING    PLAN: PT FREQUENCY: 1-2x/week  PT DURATION: 8 weeks  PLANNED INTERVENTIONS: Therapeutic exercises, Therapeutic activity, Neuromuscular re-education, Balance training, Gait training, Patient/Family education, Self Care, Joint mobilization, Dry Needling, Electrical stimulation, Spinal mobilization, Cryotherapy, Moist heat, Taping, Ionotophoresis 64m/ml Dexamethasone, Manual therapy, and Re-evaluationVestibular as needed.  PLAN FOR NEXT SESSION:  Pt went over body mechanics and posture and ADL  will need more demo for lifting and explanation  continue strength, balance and sit to stand for improvement of BERG  BERG balance, MMT, transfer from floor.   LVoncille Lo PT, AWadsworthCertified Exercise Expert for the Aging Adult  08/13/22 4:58 PM Phone: [phone]: [phone]

## 2022-08-20 ENCOUNTER — Ambulatory Visit: Payer: Medicare PPO | Admitting: Physical Therapy

## 2022-08-20 ENCOUNTER — Encounter: Payer: Self-pay | Admitting: Physical Therapy

## 2022-08-20 DIAGNOSIS — M6281 Muscle weakness (generalized): Secondary | ICD-10-CM

## 2022-08-20 DIAGNOSIS — M542 Cervicalgia: Secondary | ICD-10-CM

## 2022-08-20 DIAGNOSIS — R293 Abnormal posture: Secondary | ICD-10-CM | POA: Diagnosis not present

## 2022-08-20 DIAGNOSIS — R296 Repeated falls: Secondary | ICD-10-CM | POA: Diagnosis not present

## 2022-08-20 NOTE — Patient Instructions (Signed)
Access Code: [email or web address] URL: [email or web address] Date: 08/20/2022 Prepared by: Voncille Lo  Program Notes with the foam roll exercises you can use 2 towels rolled up, or 1 beach towels.   Exercises - Supine Deep Neck Flexor Training  - 2-3 x daily - 7 x weekly - 10 reps - 3 hold - Seated Cervical Retraction Protraction AROM  - 2-3 x daily - 7 x weekly - 1 sets - 10 reps - Seated Gentle Upper Trapezius Stretch  - 1 x daily - 7 x weekly - 1 sets - 3 reps - 30 hold - Gentle Levator Scapulae Stretch  - 1 x daily - 7 x weekly - 3 sets - 3 reps - 30 hold - Standing Shoulder Diagonal Horizontal Abduction 60/120 Degrees with Resistance  - 1 x daily - 7 x weekly - 3 sets - 10 reps - Standing Shoulder Horizontal Abduction with Resistance  - 1 x daily - 7 x weekly - 3 sets - 10 reps - Sidelying Open Book Thoracic Lumbar Rotation and Extension  - 1 x daily - 7 x weekly - 3 sets - 10 reps - Single Leg Stance with Support  - 1 x daily - 7 x weekly - 1 sets - 3 reps - 30-45 sec  hold - Standing Hip Abduction with Resistance at Ankles and Counter Support  - 1 x daily - 7 x weekly - 1 sets - 2-3 reps - Standing Hip Extension with Resistance at Ankles and Counter Support  - 1 x daily - 7 x weekly - 3 sets - 10 reps - Supine on Foam Roll Reach and Roll  - 1 x daily - 7 x weekly - 10 reps - 5 seconds hold - [place] on Foam Roll  - 1 x daily - 7 x weekly - 10 reps - Thoracic Y on Foam Roll  - 1 x daily - 7 x weekly - 1 reps - 20-30 seconds hold - Supine Static Chest Stretch on Foam Roll  - 1 x daily - 7 x weekly - 1 reps - 20-30 seconds hold - Hooklying Scapular Protraction on Foam Roll  - 1 x daily - 7 x weekly - 10 reps - Seated Assisted Cervical Rotation with Towel  - 1 x daily - 7 x weekly - 3 sets - 10 reps - Hip Flexor Stretch at Edge of Bed  - 1 x daily - 7 x weekly - 1 sets - 3 reps - 30 sec hold - Quadruped Alternating Leg Extensions  - 1 x daily - 7 x weekly - 3 sets - 10  reps  Voncille Lo, PT, [place] Certified Exercise Expert for the Aging Adult  08/20/22 4:28 PM Phone: [phone] Fax: [phone]

## 2022-08-20 NOTE — Therapy (Addendum)
OUTPATIENT PHYSICAL THERAPY CERVICAL/BALANCE/DISCHARGE NOTE PHYSICAL THERAPY DISCHARGE SUMMARY  Visits from Start of Care: 11  Current functional level related to goals / functional outcomes: As indicated below   Remaining deficits: Still with slight balance deficit but with good strength   Education / Equipment: HEP and sleep hygiene , continuing  exercise and strength at [facility]  Patient agrees to discharge. Patient goals were met. Patient is being discharged due to meeting the stated rehab goals. And being pleased with current progress.         Patient Name: Christie Harris MRN: [id number] DOB:September 15, 1941, 81 y.o., female Today's Date: 08/20/2022   PT End of Session - 08/20/22 1548     Visit Number 11    Number of Visits 16    Date for PT Re-Evaluation 08/22/22    Authorization Type [employer] [place] MEDICARE CHOICE PPO    PT Start Time 1545    PT Stop Time 1627    PT Time Calculation (min) 42 min    Activity Tolerance Patient tolerated treatment well    Behavior During Therapy WFL for tasks assessed/performed                      Past Medical History:  Diagnosis Date   Anxiety    Arthritis    greater left hip   CA of skin    squamous cell right leg-recent excision   Depression    Depression    GERD (gastroesophageal reflux disease)    Hypercholesteremia    Obesity    Osteoarthrosis    Osteoporosis    Shortness of breath    on exertion   Sleep apnea    CPAP at 13cm H2o   Sleep apnea    Past Surgical History:  Procedure Laterality Date   ABDOMINAL HYSTERECTOMY     uterus and ovaries   CATARACT EXTRACTION Right    CHOLECYSTECTOMY     COLONOSCOPY WITH PROPOFOL N/A 03/23/2013   Procedure: COLONOSCOPY WITH PROPOFOL;  Surgeon: Garlan Fair, MD;  Location: [facility];  Service: Endoscopy;  Laterality: N/A;colon polyps removed-benign   ESOPHAGOGASTRODUODENOSCOPY (EGD) WITH PROPOFOL N/A 06/16/2014   Procedure:  ESOPHAGOGASTRODUODENOSCOPY (EGD) WITH PROPOFOL;  Surgeon: Garlan Fair, MD;  Location: [facility];  Service: Endoscopy;  Laterality: N/A;   plastic surgery on nose     "deviated septum and cosmetic"   Patient Active Problem List   Diagnosis Date Noted   OSA on CPAP 07/15/2013   Obesity (BMI 30-39.9) 07/15/2013   GERD (gastroesophageal reflux disease) 07/15/2013   Depression 07/15/2013    PCP: Seward Carol, MD  REFERRING PROVIDER: Shellia Carwin, MD  REFERRING DIAG:  M54.2 (ICD-10-CM) - Neck pain  E11.42 (ICD-10-CM) - Diabetic polyneuropathy associated with type 2 diabetes mellitus ([place])  R26.81 (ICD-10-CM) - Gait instability  I95.1 (ICD-10-CM) - Orthostatic hypotension  I95.1 (ICD-10-CM) - Orthostatic intolerance    THERAPY DIAG:  Cervicalgia  Muscle weakness (generalized)  Abnormal posture  Rationale for Evaluation and Treatment Rehabilitation  ONSET DATE: neck pain  has been > 5 years,   balance problems 2 years or more  SUBJECTIVE:  SUBJECTIVE STATEMENT: I just came from gym today. I moved two trash bins to the  curb.  I also did it for my neighbors.    PERTINENT HISTORY:  HOH with hearing aids (very HOH), Anxiety/depression( well controlled, GERD,hypercholesteremia, obesity, OA, osteoporosis, sleep apnea with CPAP  PAIN:  08-20-22 0/10 08-13-22   0/10 pain 08-06-22   I have no pain 07-16-22   1/10 in neck and upper traps. 07-09-22    6/10 over bil upper traps and levator  07-09-22   Are you having pain? Yes: NPRS scale: 0/10 Pain location: back of neck up into head R >L Pain description: thorbbing, more dull pain Aggravating factors: Driving, stress, Relieving factors: lowering chin and looking down, taking tylenol  PRECAUTIONS: Fall  WEIGHT BEARING  RESTRICTIONS No  FALLS:  Has patient fallen in last 6 months? Yes. Number of falls 2 times  once when I was [place] and then the following day I fell at my granddaughters house getting out of a air mattress between wall and bed  and had no room and I fell.  I feel unsteady when I feel dizzy or lightheaded  LIVING ENVIRONMENT: Lives with: lives with their family and lives with their daughter Lives in: House/apartment Stairs: Yes: Internal: 14 steps; on left going up and External: 1 steps; none Has following equipment at home:  built in bench in shower Pt room is on the first floor and she avoids stairs OCCUPATION: retired  PLOF: Independent  PATIENT GOALS  Decrease neck pain,  decrease risk of falls, improve balance, drive with more comfort. Sleep more comfortably at night  OBJECTIVE:   DIAGNOSTIC FINDINGS:  IMPRESSION:04-29-22 No acute intracranial abnormality.  PATIENT SURVEYS:  FOTO 44%  55% predicted 07/24/2022 - 49% 08-13-22  75%  COGNITION: Overall cognitive status: Within functional limits for tasks assessed   SENSATION: Pt reports tingling in bil  hands after waking up in the morning  POSTURE: rounded shoulders, forward head, and obesity  and mild thoracic kyphosis Occiput to wall  10 cm   10 cm at risk for falls PALPATION: TTP   CERVICAL ROM:   Active ROM A/PROM (deg) eval 07/16/2022 08-13-22  Flexion 30  44  Extension 33  50  Right lateral flexion 20  34  Left lateral flexion 15  29  Right rotation 20 pain 34 no pain 45  Left rotation 23 pain 30 no pain 50   (Blank rows = not tested)  UPPER EXTREMITY ROM:  Active ROM Right eval Left eval RT/LT 08-13-22 RT/LT  Shoulder flexion 136/ P149 140/ P149 149/139 152/143  Shoulder extension      Shoulder abduction 130 134  135/145  Shoulder adduction      Shoulder extension      Shoulder internal rotation T-12 t_12  T-10/T-12  Shoulder external rotation C-7 C-6  T-1/T-1   Elbow flexion      Elbow extension      Wrist flexion      Wrist extension      Wrist ulnar deviation      Wrist radial deviation      Wrist pronation      Wrist supination       (Blank rows = not tested)  UPPER EXTREMITY MMT:  MMT Right eval Left eval RT/LT 08-20-22  Shoulder flexion 5 5 5/5  Shoulder extension     Shoulder abduction 5 5 5/5  Shoulder adduction     Shoulder  extension     Shoulder internal rotation 4 4 5/5  Shoulder external rotation 4 4 5/5  Middle trapezius 4- 4- 5/5  Lower trapezius 4- 4- 5/5  Elbow flexion     Elbow extension     Wrist flexion     Wrist extension     Wrist ulnar deviation     Wrist radial deviation     Wrist pronation     Wrist supination     Grip strength 46lb 54 lb RT 43.3lb/RT dominnate but OA in thumb is LT 63.3lb   (Blank rows = not tested)  CERVICAL SPECIAL TESTS:  Neck flexor muscle endurance test: Positive, Spurling's test: Negative, and Distraction test: Negative  BP  142/82  pulse 82 FUNCTIONAL TESTS:  5 times sit to stand: 10.98 sec   13 sec or less no risk of fall 6 minute walk test: TBA Berg Balance Scale: 50/56 Total Score: 50/56   TODAY'S TREATMENT:  [facility] Adult PT Treatment:                                                DATE: 08-20-22  BERG Total Score: 54/56  Therapeutic Exercise: Quadriped hip ext RT/LT 2 x 10 each Childs pose forward , side to side  Hip flexor stretch 30 sec 25 # KB overhead Press  1 x 10 and 1 x 8 Yoga block knee squeeze with prayer position thoracic stretch.    Mountain climbers from counter  Therapeutic Activity: Floor to mat transfer with lunging Floor to mat transfer with UE on elbow and mountain climbers Rolling on floor to quadriped.   [facility] Adult PT Treatment:                                                DATE: 08-13-22 FOTO  75%  met goal Therapeutic Exercise: Freemotion, triceps pull with 13# Freemotion alernating UE RT with LT LE forward and vice versa   2 x 10  with 13 # Worlds greatest stretch Farmers carry wit 45# KB for 10 feet 25 # KB overhead Press  1 x 10 and 1 x 8 Yoga block knee squeeze with prayer position thoracic stretch.    Modified thoracic stretch in Runner's stretch position.    Self Care: Sleep Hygiene. Lifting principles and demo ADL. Posture/body mechanics [facility] Adult PT Treatment:                                                DATE: 08-06-22 Therapeutic Exercise: supervision for correct execution by PT  Nu-step L 5 x 5 min UE/LE 15 # KB swing 3 x 8 with 90 sec to 2 min rest between sets SL hip flexion over KB side march with  slow and controlled descent with ER Alternating steps on 6 inch step x 20 each R and L Double tap on alternating steps on 6 inch step x 20 on R and L Step ups on 6 inch step Neck lateral and posterior neck isometric with ball on wall  Neuromuscular re-ed:  Close supervision by PT with all below  Cha Cha dancing steps forward and side stepping Cha Cha dancing steps forward and side stepping with skipping [place] Single limb tapping with dual task primary color circles Lunge to primary color circles on R LE leading and the with L LE leading Side stepping over cones for clearance of feet with high stepping hip flexion  [facility] Adult PT Treatment:                                                DATE: 07/31/2022 Therapeutic Exercise: Nu-step L 5 x 5 min UE/LE Upper trap stretch 2 x 30 sec bil Foam roll routine 2 x 10 ea: Ceiling punches, horizontal ab/adduction, alternating ceiling punches, x to y, and back stroke Manual Therapy: Sub-occipital release Upper trap MTPR x2 ea.    [facility] Adult PT Treatment:                                                DATE: 07/29/2022 Therapeutic Exercise: Nu-Step L6 x 5 min LE only Step up/ down L 2 x 10 bil using 6 inch steps.  Neuromuscular re-ed: Standing on stable surface counter cone taps alternating Lt & Rt starting 1 ft away 1 x 20, 2 ft away 1 x 20, 3 ft way 1  x 20 - progressed to performed on airx pad Alternating cone taps with back leg on 2 inch step 2 x 20, and switch Modified tandem walking forward/ backward in // x 6 Static modified tandem postion with head turns 2 x 10 Therapeutic Activity: Navigating up/ down 6 inch steps x 6 , focusing on last step and foot placement to avoid hitting heel on the step   [facility] Adult PT Treatment:                                                DATE: 07/24/2022 Therapeutic Exercise: Supine scapular retraction with ER 2 x 12 with GTB Supine bil scapular retraction with ER 1 x 12 with GTB, diagonal 1 x 12 Seated Thoracic rotation hugging black physioball 2 x 10 Seated thoracic extension 2 x 10 Manual Therapy: Sub-occipital release Tack and stretch for sub-occipitals using tennis balls Therapeutic Activity: Log rolling to the L x 5, and to the R x 5 (verbal cues for proper form and technique to maximize ease Self Care: Reviewed posture in the car and modifed head rest to help promote proper positioning, and reviewed utilizing chin tuck     PATIENT EDUCATION:  Education details: POC, Explanation of findings , initial HEP issued  FOTO done Person educated: Patient Education method: Explanation, Demonstration, Tactile cues, Verbal cues, and Handouts Education comprehension: verbalized understanding, returned demonstration, verbal cues required, tactile cues required, and needs further education   HOME EXERCISE PROGRAM:  Access Code: [email or web address] URL: [email or web address] Date: 08/20/2022 Prepared by: Voncille Lo  Program Notes with the foam roll exercises you can use 2 towels rolled up, or 1 beach towels.   Exercises - Supine Deep Neck Flexor Training  - 2-3 x daily - 7 x weekly - 10 reps - 3 hold - Seated Cervical  Retraction Protraction AROM  - 2-3 x daily - 7 x weekly - 1 sets - 10 reps - Seated Gentle Upper Trapezius Stretch  - 1 x daily - 7 x weekly - 1 sets - 3 reps - 30  hold - Gentle Levator Scapulae Stretch  - 1 x daily - 7 x weekly - 3 sets - 3 reps - 30 hold - Standing Shoulder Diagonal Horizontal Abduction 60/120 Degrees with Resistance  - 1 x daily - 7 x weekly - 3 sets - 10 reps - Standing Shoulder Horizontal Abduction with Resistance  - 1 x daily - 7 x weekly - 3 sets - 10 reps - Sidelying Open Book Thoracic Lumbar Rotation and Extension  - 1 x daily - 7 x weekly - 3 sets - 10 reps - Single Leg Stance with Support  - 1 x daily - 7 x weekly - 1 sets - 3 reps - 30-45 sec  hold - Standing Hip Abduction with Resistance at Ankles and Counter Support  - 1 x daily - 7 x weekly - 1 sets - 2-3 reps - Standing Hip Extension with Resistance at Ankles and Counter Support  - 1 x daily - 7 x weekly - 3 sets - 10 reps - Supine on Foam Roll Reach and Roll  - 1 x daily - 7 x weekly - 10 reps - 5 seconds hold - [place] on Foam Roll  - 1 x daily - 7 x weekly - 10 reps - Thoracic Y on Foam Roll  - 1 x daily - 7 x weekly - 1 reps - 20-30 seconds hold - Supine Static Chest Stretch on Foam Roll  - 1 x daily - 7 x weekly - 1 reps - 20-30 seconds hold - Hooklying Scapular Protraction on Foam Roll  - 1 x daily - 7 x weekly - 10 reps - Seated Assisted Cervical Rotation with Towel  - 1 x daily - 7 x weekly - 3 sets - 10 reps - Hip Flexor Stretch at Edge of Bed  - 1 x daily - 7 x weekly - 1 sets - 3 reps - 30 sec hold - Quadruped Alternating Leg Extensions  - 1 x daily - 7 x weekly - 3 sets - 10 reps ASSESSMENT:  CLINICAL IMPRESSION: Ms Durio arrives after working out at [employer] improved to 75%. Pt AROM of cervical motion improved.  See flow chart for improvement in all planes and 5/5  MMT. Pt BERG improved to 54/56 and still with SLS deficit but improved from evaluation. At leat 5-10 seconds.  Pt with 0/10 neck pain.  Session concentrates on dynamic stretching, and strengthening and emphasis  floor to mat/ chair transfers.  ALL LTG achieved and pt is ready for DC.Pt also able  to lift 25 # KB above head and carry 45# for 10 feet with some trunk lean compensation. HEP modified for DC. Pt is a joy for whom to serve.   OBJECTIVE IMPAIRMENTS decreased balance, decreased ROM, decreased strength, dizziness, and hypomobility.   ACTIVITY LIMITATIONS  turning head for driving and sleeping   PARTICIPATION LIMITATIONS: driving  [place] with hearing aids (very HOH), Anxiety/depression( well controlled, GERD,hypercholesteremia, obesity, OA, osteoporosis, sleep apnea with CPAP are also affecting patient's functional outcome.   REHAB POTENTIAL: Good  CLINICAL DECISION MAKING: Evolving/moderate complexity  EVALUATION COMPLEXITY: Moderate   GOALS: Goals reviewed with patient?  Partially will need to review 2nd visit  SHORT TERM GOALS: Target  date: 07/30/2022   Pt will be independent with initial HEP Baseline: limited knowledge Goal status: MET  2.  Pt will be educated on sleeping postures and position to improve sleep quality at night for more restorative sleep Baseline: unable to sleep on back due to neck pain Goal status:  MET  3.  Demonstrate understanding of proper sitting posture, body mechanics, work ergonomics, and be more conscious of position and posture throughout the day.  Baseline: forward head posture moderate to severe , 07-09-22 given handout on posture and body mechanics Goal status: MET  4.  Report decreased pain by 25% in neck Baseline: 9/10 trying to turn neck, 07-09-22  at night 6/10 but in clinic 2/10 Goal status: MET   LONG TERM GOALS: Target date: 08/27/2022  Pt will be independent with  advanced HEP Baseline: limited knowledge Goal status:met  2.  Pt will be able to demonstrate getting up and down from the floor to decrease fear of falling Baseline: avoids falling  08-20-22  Independent with floor to stand transfer Goal status: MET    3.  Pt will improve BERG to at least 55/56 to show improved balance  Baseline: 50/56  BERG eval 08-20-22  54/56 Goal status: MET  4.  FOTO will improve from  44%  to   55%  indicating improved functional mobility  Baseline: 44% eval 08-13-22  75% Goal status: MET  5.  Improve UE MMT to at least 4 to 4+/5 to improve posture and ability to visually scan environment Baseline: 10 cm occiput to head measure  10 cm at risk for fall with forward head 11-14 -23  Pt able to lift BIL 25 lb above head 08-20-22  Normal 5/5 strength Goal status: MET    PLAN: PT FREQUENCY: 1-2x/week  PT DURATION: 8 weeks  PLANNED INTERVENTIONS: Therapeutic exercises, Therapeutic activity, Neuromuscular re-education, Balance training, Gait training, Patient/Family education, Self Care, Joint mobilization, Dry Needling, Electrical stimulation, Spinal mobilization, Cryotherapy, Moist heat, Taping, Ionotophoresis 4mg /ml Dexamethasone, Manual therapy, and Re-evaluationVestibular as needed.  PLAN FOR NEXT SESSION:  Pt went over body mechanics and posture and ADL  will need more demo for lifting and explanation  continue strength, balance and sit to stand for improvement of BERG  BERG balance, MMT, transfer from floor. Ready for [place], PT, [place] Certified Exercise Expert for the Aging Adult  08/20/22 5:55 PM Phone: [phone] Fax: [phone]   Voncille Lo, [place], [place] Certified Exercise Expert for the Aging Adult  11/07/22 12:09 PM Phone: [phone] Fax: [phone]

## 2022-08-26 DIAGNOSIS — Z1231 Encounter for screening mammogram for malignant neoplasm of breast: Secondary | ICD-10-CM | POA: Diagnosis not present

## 2022-08-27 DIAGNOSIS — H35363 Drusen (degenerative) of macula, bilateral: Secondary | ICD-10-CM | POA: Diagnosis not present

## 2022-08-27 DIAGNOSIS — H532 Diplopia: Secondary | ICD-10-CM | POA: Diagnosis not present

## 2022-08-27 DIAGNOSIS — H04123 Dry eye syndrome of bilateral lacrimal glands: Secondary | ICD-10-CM | POA: Diagnosis not present

## 2022-08-27 DIAGNOSIS — Z961 Presence of intraocular lens: Secondary | ICD-10-CM | POA: Diagnosis not present

## 2022-08-28 DIAGNOSIS — E1169 Type 2 diabetes mellitus with other specified complication: Secondary | ICD-10-CM | POA: Diagnosis not present

## 2022-09-02 DIAGNOSIS — S92511A Displaced fracture of proximal phalanx of right lesser toe(s), initial encounter for closed fracture: Secondary | ICD-10-CM | POA: Diagnosis not present

## 2022-09-02 DIAGNOSIS — S92521A Displaced fracture of medial phalanx of right lesser toe(s), initial encounter for closed fracture: Secondary | ICD-10-CM | POA: Diagnosis not present

## 2022-09-02 DIAGNOSIS — W19XXXA Unspecified fall, initial encounter: Secondary | ICD-10-CM | POA: Diagnosis not present

## 2022-09-02 DIAGNOSIS — S99921A Unspecified injury of right foot, initial encounter: Secondary | ICD-10-CM | POA: Diagnosis not present

## 2022-09-03 DIAGNOSIS — M79671 Pain in right foot: Secondary | ICD-10-CM | POA: Diagnosis not present

## 2022-09-03 DIAGNOSIS — S92514A Nondisplaced fracture of proximal phalanx of right lesser toe(s), initial encounter for closed fracture: Secondary | ICD-10-CM | POA: Diagnosis not present

## 2022-09-03 DIAGNOSIS — S92511A Displaced fracture of proximal phalanx of right lesser toe(s), initial encounter for closed fracture: Secondary | ICD-10-CM | POA: Insufficient documentation

## 2022-09-04 ENCOUNTER — Telehealth: Payer: Self-pay | Admitting: Pulmonary Disease

## 2022-09-04 NOTE — Telephone Encounter (Signed)
Pt is scheduled for HST on 12/12 but she wanted provider to know that she has some broken toes in her left foot and has a boot on. I told it her it shouldn't affect the results of the HST but she wanted you to be informed - her dr says she will not need surgery to heal.

## 2022-09-06 NOTE — Telephone Encounter (Signed)
Returned call and left a detailed message about wearing her boot for a broken toe while having HST done. Dr Elsworth Soho stated that it will not hurt anything. Nothing further needed.

## 2022-09-10 ENCOUNTER — Ambulatory Visit: Payer: Medicare PPO

## 2022-09-10 DIAGNOSIS — G4733 Obstructive sleep apnea (adult) (pediatric): Secondary | ICD-10-CM

## 2022-09-12 NOTE — Progress Notes (Signed)
I saw Christie Harris in neurology clinic on 09/18/22 in follow up for dizziness and trouble walking.  HPI: Christie Harris is a 81 y.o. year old right-handed female with a medical history of DM2 c/b neuropathy, HLD, anxiety, depression, OSA (on CPAP), OA, hearing loss, GERD  who we last saw on 06/12/22.  To briefly review: I first saw patient on 06/12/22 at which time we discussed the following: Gait imbalance with falls x2 this weekend -This has been going on for about 1 year. -She describes walking, then staggering. She mentions that she walks funny. She feels like her feet are close to the ground and shuffling. She denies feeling stuck.  -She describes dizzy vs lightheaded. She feels like her head is not attached to her body. She does not think her body can do what she is telling it to do. She even finds it difficult to turn in bed. -For the first 2 hours every morning her hands shake and she is more likely to have imbalance than later in the day. She thinks the shaking is when she is moving and when staying still. She mentions reaching for her glasses in the morning and her hand shaking very badly when reaching for them. -She states that it is hard to get in cars and that she tends to walk behind everyone else (can't keep up). -She sleeps alone, but does not think she is acting out her dreams or having nightmares. She wakes up feeling rested.   Dizzy, hot, shaky, weak feeling episodes. -She describes acute attacks that come on suddenly -This has been going on around 10 years. She was standing at the time. It lasted a few minutes then resolved. It was very infrequent at that time (maybe yearly). -The episodes became more frequent over the last 2 years. Patient goes to the gym am then goes to [employer] directly afterwards. She eats before going to the gym, but does not drink water well. She thinks she tends to be dehydrated per her account. She tends to have episodes while grocery  shopping. -She can have episodes at home at well. She had an episode she remembers when she was picking up dishes. -The episodes still last a few minutes, but now occur about once per week.  -She does not know what helps, but it resolves on its own. She isn't sure that eating something helps or if sitting helps.    Pain in back of head -Patient has had this for about a decade. She describes it as a throbbing pain that is located on the back of her head and neck. It may be a little more on the right side. She has the pain daily. Leaning or laying on something makes it worse. Even a pillow causes discomfort. Her CPAP runs behind her head but she does not think things are better if she does not have it on her. She endorses that the CPAP strap is tight. She also mentions she needs new equipment. -She rates the pain as 7/10. It happens more at night but does not seem to be worse with position. -She previously followed with Dr Fransico Him for her CPAP (last seen in 2014). She does not think she has anyone following this currently.   Vision loss -Patient had cataract surgery on her left eye at the beginning of the [place] pandemic. She has felt her vision has been poor since. -1 year ago at her eye exam she was told there was no problems  with her eyes. -She has an appointment next week for her annual check up. She thinks she sees an optometrist. -She endorses double vision with one line of text angling off. Her vision is very blurry. It is constant and does not fluctuate, but there may be worse symptoms at night. -Closing one eye does not help.   Patient was seen by her PCP, Dr. Delfina Redwood on 03/14/22. She mentioned dizziness with change of position. Orthostatic vitals were negative. The dizziness then became more random and not associated with position change. She was off balance when walking. She also mentioned vision changes and trouble reading. Per notes, patient saw an ophthalmologist who found no problems  with her eyes. Patient was referred to neurology for further evaluation.  Most recent Assessment and Plan (06/12/22): Her neurological examination is pertinent for reduced range of motion and tightness in neck, diplopia with sustained up gaze, and diminished sensation in feet > hands. Her strength and reflexes are normal. Her vital signs were significant for a SBP drop of 38 when transitioning from laying to sitting. She also experienced dizziness and lightheadedness. Available diagnostic data is significant for HbA1c of 6.7.    Patient's symptoms are likely multifactorial. 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 This could contribute to imbalance and her autonomic symptoms (orthostatic hypotension/orthostatic intolerance). I will send labs for other treatable causes that could be contributing to neuropathy. Regarding her neck and head pain, this is consistent with cervicalgia. This could also contribute to balance. I will send a referral to PT for this. Another possible contributor to her head pain is her CPAP, which is very tight per her report. She does not follow with sleep medicine and states she needs new equipment. I will refer her to sleep medicine today.   Finally, her vision problems are not clearly neurologic. She thinks they do not improve with covering one eye. Given that she thinks her double vision symptoms may fluctuate and be worse in the evening, I will send AChR ab to screen for myasthenia gravis. She has no other clear signs of MG though, so this is less likely. She is due for an eye exam, so ophtho evaluation is reasonable.   PLAN: -Blood work: B12, IFE, SPEP, TSH, AChR ab (binding, blocking, and modulating) -Physical therapy referral -Sleep medicine referral -Opthalmologist referral -Orthostatic hypotension/orthostatic intolerance instructions given -May consider EMG if other work up is unrevealing.  Since their last  visit: Patient's B12 was borderline low, so I recommended supplementation with B12 1000 mcg daily. Patient has been taking the B12 supplement. Labs were otherwise normal.  Patient had a recent fall when getting up to use to restroom at night. She hurt her right foot (broke 3 bones).  She went to PT. This improved her neck range of motion and neck pain modestly.   Patient had her vision checked and only found to have dry eyes. She sees improvement when she uses her eye drops.  Patient did her sleep study last week. She got the report yesterday. She had mild OSA. She is continuing her CPAP.   MEDICATIONS:  Outpatient Encounter Medications as of 09/18/2022  Medication Sig   busPIRone (BUSPAR) 15 MG tablet Take 15 mg by mouth 2 (two) times daily.   calcium carbonate (OS-CAL) 600 MG TABS Take 600 mg by mouth daily.   citalopram (CELEXA) 20 MG tablet Take 20 mg by mouth every evening.   cyanocobalamin (VITAMIN B12) 1000 MCG tablet Take  1,000 mcg by mouth daily.   omeprazole (PRILOSEC) 20 MG capsule Take 20 mg by mouth every evening.   rosuvastatin (CRESTOR) 5 MG tablet Take 5 mg by mouth every evening.   No facility-administered encounter medications on file as of 09/18/2022.    PAST MEDICAL HISTORY: Past Medical History:  Diagnosis Date   Anxiety    Arthritis    greater left hip   CA of skin    squamous cell right leg-recent excision   Depression    Depression    GERD (gastroesophageal reflux disease)    Hypercholesteremia    Obesity    Osteoarthrosis    Osteoporosis    Shortness of breath    on exertion   Sleep apnea    CPAP at 13cm H2o   Sleep apnea     PAST SURGICAL HISTORY: Past Surgical History:  Procedure Laterality Date   ABDOMINAL HYSTERECTOMY     uterus and ovaries   CATARACT EXTRACTION Right    CHOLECYSTECTOMY     COLONOSCOPY WITH PROPOFOL N/A 03/23/2013   Procedure: COLONOSCOPY WITH PROPOFOL;  Surgeon: Garlan Fair, MD;  Location: [facility];   Service: Endoscopy;  Laterality: N/A;colon polyps removed-benign   ESOPHAGOGASTRODUODENOSCOPY (EGD) WITH PROPOFOL N/A 06/16/2014   Procedure: ESOPHAGOGASTRODUODENOSCOPY (EGD) WITH PROPOFOL;  Surgeon: Garlan Fair, MD;  Location: [facility];  Service: Endoscopy;  Laterality: N/A;   plastic surgery on nose     "deviated septum and cosmetic"    ALLERGIES: Allergies  Allergen Reactions   Darvocet [Propoxyphene N-Acetaminophen] Other (See Comments)    "out of my mind"   Scallops [Shellfish Allergy] Nausea And Vomiting    FAMILY HISTORY: Family History  Problem Relation Age of Onset   Breast cancer Mother    Diabetes Father    Congestive Heart Failure Father    Hypertension Father    Lung cancer Sister    Heart attack Brother    Kidney disease Brother    Hypertension Brother    Kidney disease Brother     SOCIAL HISTORY: Social History   Tobacco Use   Smoking status: Never    Passive exposure: Past   Smokeless tobacco: Never  Vaping Use   Vaping Use: Never used  Substance Use Topics   Alcohol use: Yes    Comment: occ   Drug use: No   Social History   Social History Narrative   Right handed   Caffeine 1-2 mug in am 2 coke in pm    Live in a two story home with daughter    Objective:  Vital Signs:  BP (!) 140/74   Pulse 90   Ht 5' 6.5" (1.689 m)   Wt 188 lb 12.8 oz (85.6 kg)   SpO2 98%   BMI 30.02 kg/m   General: General appearance: Awake and alert. No distress. Cooperative with exam. HEENT: Atraumatic. Anicteric. Reduced range of motion of the neck. Still ongoing tightness of paraspinal and trapezius muscles. Lungs: Non-labored breathing on room air   Neurological: Mental Status: Alert. Speech fluent. No pseudobulbar affect Cranial Nerves: CNII: No RAPD. Visual fields intact. CNIII, IV, VI: PERRL. No nystagmus. EOMI. CN V: Facial sensation intact bilaterally to fine touch. CN VII: Facial muscles symmetric and strong. No ptosis at rest CN VIII:  Hears finger rub well bilaterally. CN IX: No hypophonia. CN X: Palate elevates symmetrically. CN XI: Full strength shoulder shrug bilaterally. CN XII: Tongue protrusion full and midline. No atrophy or fasciculations. No significant dysarthria Motor: Tone  is normal.  Individual muscle group testing (MRC grade out of 5):  Movement     Neck flexion 5    Neck extension 5     Right Left   Shoulder abduction 5 5   Elbow flexion 5 5   Elbow extension 5 5   Finger abduction - FDI 5 5   Finger abduction - ADM 5 5   Finger extension 5 5   Finger distal flexion - 2/'3 5 5   '$ Finger distal flexion - 4/'5 5 5   '$ Thumb flexion - FPL 5 5    Hip flexion 5 5   Knee extension 5 5   Knee flexion 5 5   Dorsiflexion Not assessed due to boot 5   Plantarflexion Not assessed due to boot 5     Reflexes:  Right Left  Bicep 2+ 2+  Tricep 2+ 2+  BrRad 2+ 2+  Knee 2+ 2+  Ankle 2+ 2+   Sensation: Diminished in feet with pinprick Coordination: Intact finger-to- nose-finger bilaterally. Gait: Able to rise from chair with arms crossed unassisted. Normal, narrow-based gait.   Lab and Test Review: New results: 06/12/22: TSH: 1.26 B12: 288 IFE and SPEP unremarkable MG panel (AChR binding abs, striated muscle abs) negative  Previously reviewed results: External labs: Normal or unremarkable: CMP HbA1c (12/20/21): 6.7   CT head wo contrast (04/29/22): FINDINGS: Brain: No intracranial hemorrhage, mass effect, or evidence of acute infarct. No hydrocephalus. No extra-axial fluid collection. Mild cerebral volume loss. Ill-defined hypoattenuation within the cerebral white matter is nonspecific but consistent with chronic small vessel ischemic disease.   Vascular: No hyperdense vessel or unexpected calcification.   Skull: No fracture or focal lesion.   Sinuses/Orbits: No acute finding. Paranasal sinuses and mastoid air cells are well aerated.   Other: None.   IMPRESSION: No acute intracranial  abnormality.   MRI cervical spine (12/23/2002): FINDINGS:  THERE IS REVERSAL OF THE NORMAL CERVICAL LORDOSIS CENTERED AT THE C5 LEVEL.  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  THE  PREVERTEBRAL SOFT TISSUES AND VISUALIZED PORTION OF THE POSTERIOR FOSSA ARE NORMAL.  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  C2-3:   NORMAL.  C3-4:   THERE IS A SMALL POSTERIOR DISK/OSTEOPHYTE COMPLEX WITH NO SUBSEQUENT CENTRAL CANAL  STENOSIS OR NEURAL FORAMINAL NARROWING.  C4-5, C5-6, AND C6-7:   THERE ARE MODERATE SIZED POSTERIOR DISK/OSTEOPHYTE COMPLEXES WHICH EFFACE  THE VENTRAL SUBARACHNOID SPACE AND FLATTEN THE VENTRAL ASPECT OF THE CORD.  NO SIGNIFICANT NEURAL  FORAMINAL NARROWING IS IDENTIFIED.  THERE IS NO ABNORMAL CORD SIGNAL.  C7-T1:   NORMAL.   IMPRESSION  1.  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  2.  THERE ARE MODERATE SIZED POSTERIOR DISK/OSTEOPHYTE COMPLEXES AT C4-5, C5-6, AND C6-7 WHICH RESULT IN MILD CENTRAL CANAL NARROWING; HOWEVER, THERE IS NO ABNORMAL CORD SIGNAL AND NO SIGNIFICANT NEURAL FORAMINAL NARROWING BILATERALLY.  ASSESSMENT: This is Christie Harris, a 81 y.o. female with neck and posterior head pain, gait imbalance/dizziness, and vision changes. Patient has improved modestly per her report with B12 supplement and PT. She follows the orthostatic hypotension recommendations, but did have one fall when getting up to use the bathroom in the middle of the night and broke her right foot. Her eyes have improved with eye drops as  she was diagnosed with dry eyes.  Plan: -Recheck B12 today -Continue B12 1000 mcg daily -Continue home PT exercises -Continue orthostatic hypotension recommendations -Continue eye drops for dry eyes  Return to clinic in 6 months  Total time spent reviewing  records, interview, history/exam, documentation, and coordination of care on day of encounter:  53 min  Kai Levins, MD

## 2022-09-13 ENCOUNTER — Telehealth: Payer: Self-pay | Admitting: Pulmonary Disease

## 2022-09-13 DIAGNOSIS — G4733 Obstructive sleep apnea (adult) (pediatric): Secondary | ICD-10-CM | POA: Diagnosis not present

## 2022-09-13 NOTE — Telephone Encounter (Signed)
HST showed very mild OSA with AHI 6/ hr  Please ensure that she has obtained supplies from DME.  If needed , copy of the study can be sent to DME.  OV with me/APP in 6 wks to discuss treatment options going forward

## 2022-09-17 NOTE — Telephone Encounter (Signed)
Called Pt to let her know about her HST. Pt stated understanding and nothing further needed.

## 2022-09-18 ENCOUNTER — Other Ambulatory Visit (INDEPENDENT_AMBULATORY_CARE_PROVIDER_SITE_OTHER): Payer: Medicare PPO

## 2022-09-18 ENCOUNTER — Encounter: Payer: Self-pay | Admitting: Neurology

## 2022-09-18 ENCOUNTER — Ambulatory Visit: Payer: Medicare PPO | Admitting: Neurology

## 2022-09-18 VITALS — BP 140/74 | HR 90 | Ht 66.5 in | Wt 188.8 lb

## 2022-09-18 DIAGNOSIS — E538 Deficiency of other specified B group vitamins: Secondary | ICD-10-CM | POA: Diagnosis not present

## 2022-09-18 DIAGNOSIS — M542 Cervicalgia: Secondary | ICD-10-CM | POA: Diagnosis not present

## 2022-09-18 DIAGNOSIS — R2681 Unsteadiness on feet: Secondary | ICD-10-CM

## 2022-09-18 DIAGNOSIS — G4733 Obstructive sleep apnea (adult) (pediatric): Secondary | ICD-10-CM

## 2022-09-18 DIAGNOSIS — I951 Orthostatic hypotension: Secondary | ICD-10-CM

## 2022-09-18 DIAGNOSIS — H04123 Dry eye syndrome of bilateral lacrimal glands: Secondary | ICD-10-CM | POA: Diagnosis not present

## 2022-09-18 DIAGNOSIS — E1142 Type 2 diabetes mellitus with diabetic polyneuropathy: Secondary | ICD-10-CM

## 2022-09-18 LAB — VITAMIN B12: Vitamin B-12: 515 pg/mL (ref 211–911)

## 2022-09-18 NOTE — Patient Instructions (Signed)
You are doing well and trending in the right direction.  Continue your home PT exercises.  Continue B12 supplement (1000 mcg daily). We will recheck that level today.  Continue to follow the orthostatic hypotension recommendations you have from last visit. Be careful when first standing. Pause and make sure you are not off balance. Do anything it takes to have 0 falls.  I would like to see you back in clinic in 6 months.  The physicians and staff at [facility] are committed to providing excellent care. You may receive a survey requesting feedback about your experience at our office. We strive to receive "very good" responses to the survey questions. If you feel that your experience would prevent you from giving the office a "very good " response, please contact our office to try to remedy the situation. We may be reached at [phone]. Thank you for taking the time out of your busy day to complete the survey.  Kai Levins, MD [facility]  Guidelines for the Nonpharmacological Treatment of Orthostatic Hypotension/ Orthostatic Tachycardia/ Orthostatic Intolerance  Make all postural changes from lying to sitting or sitting to standing, slowly.  Drink 2.0-2.5 L of fluid per day (if okay with your other doctors).  Increase sodium in the diet to 3-5 g per day (if okay with your other doctors).  Avoid large meals which can cause low blood pressure during digestion. It is better to eat smaller meals more often than 3 large meals.  Avoid alcohol. Alcohol can cause blood to pool in the legs which may worsen low blood pressure reactions when standing.  Perform lower extremity exercises to improve strength of the leg muscles. This will help prevent blood from pooling in the legs when standing and walking. Raise the head of the bed by 6-10 inches. The entire bed must be at an angle. Raising only the head portion of the bed at the waist level or using pillows will not be effective.  Raising the head of the bed will reduce urine formation overnight and there will be more volume in the circulation in the morning. It may also help orthostatic tolerance during the day.  During bad days or prior to engaging in more physical activity than usual, drink 500 ml of water quickly. This will result in increased blood pressure within 5 minutes of drinking the water. The effect will last up to a few hours and may improve orthostatic intolerance.  Use custom-fitted elastic support stockings. This will reduce a tendency for blood to pool in the legs when standing and may improve orthostatic intolerance. Abdominal binder or "SPANX" may also be useful.  Use physical counter-maneuvers such as leg crossing, squatting, or raising and resting the leg on a chair. These maneuvers increase blood pressure and can improve orthostatic intolerance.  Gently escalated aerobic physical activity program for graded reconditioning (see details below)  Home-based Exercise Plan for Patients with Orthostatic Intolerance (as may be seen in POTS and related disorders)  Level 1 - Reclined Gentle Movements This is the starting point for those patients most severely disabled by an autonomic disorder. If you are bedridden, this is your first step. We have known dysautonomia patients who were bedridden for years, and we able to work their way from "barely able to move" to biking 45 minutes a day, everyday. It will be hard. It may make you feel worse in the beginning. But the human body was meant to move. Just take baby steps until you can make it to  your goal. Some patients can only do one minute a day, or one minute at a time, a few times a day. And they do this every day for a week, and then they increase to two minutes a day the next week. This is a very slow process, but improving your health slowly is better than not improving it at all. Helpful exercises may include:  Leg Pillow Squeeze - while laying down or reclined  in bed, put a pillow folded between your knees and squeeze. Hold it for 10 seconds. Repeat.  Arm Pillow Squeeze - put the pillow folded between your palms and squeeze together as though you were putting your hands into a praying position. Hold it for 10 seconds. Repeat.  Alphabet Toes - while laying in bed, write your name in the air with your toes. If you can build up your strength, write the whole alphabet. Do this several times a day.  Side Leg Lifts - while laying on your side, lift your leg up sideways and then bring your leg back down, without touching your legs together. Repeat.  Front Leg Lifts - While laying on your back, life your left leg up, pointing your toe towards the ceiling. Repeat. Switch to right leg.  Gentle Stretching - any kind of stretching helps move blood around in the body and takes stress of your joints if you have been sitting or laying in the same position for a long time. Go through the entire body doing mild stretches, from feet, to legs, to back, to arms, to neck. Doing this when you wake up can be a great way to start the day, and repeating your stretches before bed can help you relax and sleep better.  Level 2 - Recumbent Cardio Exercises This is probably the level that most patients will be able to begin with, although everyone can benefit from the gentle stretching and toning exercises described in Level 1.  Always begin your workout with 5-10 minutes of stretching and/or yoga to warm up your muscles and protect your joints from injury.  Since the point of these exercises is to get your cardiovascular system to be more efficient, you will want to set a target heart rate for your workout. You should speak to your doctor about this because medications and other medical conditions can impact your target heart rate, but most patients can tolerate a workout at 75% to 80% of their maximum heart rate. [facility] has a Target Heart Rate Calculator you can use as a guide  when speaking with your doctor.  You may want to purchase an exercise heart rate monitor to wear during your workouts to help you keep your heart rate within your target zone. We do not endorse any specific products, but exercise heart rate monitors with chest straps are usually more accurate than pulse oximeters you place on your finger. This is especially so for dysautonomia patients who have abnormalities in peripheral blood flow (common in some forms of dysautonomia), as this is more likely to give an inaccurate reading using a finger based monitor.  Suggested reclined cardiovascular exercises include:  Rowing - use a rowing machine, or if you are feeling well enough, a kayak. You may want to start out slow, maybe 2-5 minutes a day. At your own pace, adding a few minutes per week, try to work your way up to 45 minutes per day, 5 days a week, with 30 minutes of your routine done in your target heart rate  zone. Be sure to warm up at the beginning and cool down at the end.  Recumbent Biking - recumbent exercise bikes are different than regular exercise bikes. They seat the rider in a reclined position, rather than upright. Try recumbent biking a few minutes a day, adding a few minutes each week, until you can work out 45 minutes a day, five days a week, with 30 minutes of that workout in your target heart rate zone. Be sure to warm up at the beginning and cool down at the end of each workout.  Swimming - The pressure from water helps prevent orthostatic symptoms. Dysautonomia patients who have been bedridden for years may be able to stand upright for an hour in a pool, because the pressure from the water prevents orthostatic symptoms from occurring, or lessens their impact. Dysautonomia patients can take advantage of this to get a good cardio workout, or to focus on stretching and strength training in the water. Always swim with a spotter or a buddy who can keep and eye on you, just in case you develop  lightheadedness or other symptoms that would make it unsafe to be in a pool. It may be best to start your swimming exercise program at a pool with a lifeguard, or with a Physical Therapist who specializes in aquatic therapy. A good old fashioned kick board can be a great tool for dysautonomia patients. You can kick your way around the pool, which gives you a good cardio workout, and all that kicking helps strengthen your legs. Toning up your legs and core is a great way to minimize orthostatic symptoms.  Weight Training - Most dysautonomia patients can benefit from overall increase in tone and strength. The stronger our muscles are, the more efficiently they use oxygen, the better we will be able to tolerate orthostatic stress. Special emphasis can be placed on strengthening leg and core muscles. Some patients find it useful to wear 2 to 4 lb. weights that attach to the ankles with velcro. This can really help with leg strength if you wear them often enough. There are also machines at the gym that assist with core and leg weight training. Begin with light weights, and use them in a reclined or seated position. Some patients become extra symptomatic when lifting their arms over their head, so take extra care if attempting to do that.  Level 3 - Normal Workouts Some dysautonomia patients are able to jog, run marathons or walk several miles a week. These patients should do whatever they can to continue these activities. Dysautonomia patients who are well-conditioned should exercise 45 minutes a day, at least 3 days per week. Special emphasis should be placed on leg and core strength, and cardiovascular exercises.  Preventing Falls at [facility] are common, often dreaded events in the lives of older people. Aside from the obvious injuries and even death that may result, fall can cause wide-ranging consequences including loss of independence, mental decline, decreased activity and mobility. Younger people are  also at risk of falling, especially those with chronic illnesses and fatigue.  Ways to reduce risk for falling Examine diet and medications. Warm foods and alcohol dilate blood vessels, which can lead to dizziness when standing. Sleep aids, antidepressants and pain medications can also increase the likelihood of a fall.  Get a vision exam. Poor vision, cataracts and glaucoma increase the chances of falling.  Check foot gear. Shoes should fit snugly and have a sturdy, nonskid sole and a broad, low  heel  Participate in a physician-approved exercise program to build and maintain muscle strength and improve balance and coordination. Programs that use ankle weights or stretch bands are excellent for muscle-strengthening. Water aerobics programs and low-impact Tai Chi programs have also been shown to improve balance and coordination.  Increase vitamin D intake. Vitamin D improves muscle strength and increases the amount of calcium the body is able to absorb and deposit in bones.  How to prevent falls from common hazards Floors - Remove all loose wires, cords, and throw rugs. Minimize clutter. Make sure rugs are anchored and smooth. Keep furniture in its usual place.  Chairs -- Use chairs with straight backs, armrests and firm seats. Add firm cushions to existing pieces to add height.  Bathroom - Install grab bars and non-skid tape in the tub or shower. Use a bathtub transfer bench or a shower chair with a back support Use an elevated toilet seat and/or safety rails to assist standing from a low surface. Do not use towel racks or bathroom tissue holders to help you stand.  Lighting - Make sure halls, stairways, and entrances are well-lit. Install a night light in your bathroom or hallway. Make sure there is a light switch at the top and bottom of the staircase. Turn lights on if you get up in the middle of the night. Make sure lamps or light switches are within reach of the bed if you have to get up  during the night.  Kitchen - Install non-skid rubber mats near the sink and stove. Clean spills immediately. Store frequently used utensils, pots, pans between waist and eye level. This helps prevent reaching and bending. Sit when getting things out of lower cupboards.  Living room/ Bedrooms - Place furniture with wide spaces in between, giving enough room to move around. Establish a route through the living room that gives you something to hold onto as you walk.  Stairs - Make sure treads, rails, and rugs are secure. Install a rail on both sides of the stairs. If stairs are a threat, it might be helpful to arrange most of your activities on the lower level to reduce the number of times you must climb the stairs.  Entrances and doorways - Install metal handles on the walls adjacent to the doorknobs of all doors to make it more secure as you travel through the doorway.  Tips for maintaining balance Keep at least one hand free at all times. Try using a backpack or fanny pack to hold things rather than carrying them in your hands. Never carry objects in both hands when walking as this interferes with keeping your balance.  Attempt to swing both arms from front to back while walking. This might require a conscious effort if Parkinson's disease has diminished your movement. It will, however, help you to maintain balance and posture, and reduce fatigue.  Consciously lift your feet off of the ground when walking. Shuffling and dragging of the feet is a common culprit in losing your balance.  When trying to navigate turns, use a "U" technique of facing forward and making a wide turn, rather than pivoting sharply.  Try to stand with your feet shoulder-length apart. When your feet are close together for any length of time, you increase your risk of losing your balance and falling.  Do one thing at a time. Don't try to walk and accomplish another task, such as reading or looking around. The decrease in  your automatic reflexes complicates motor function, so  the less distraction, the better.  Do not wear rubber or gripping soled shoes, they might "catch" on the floor and cause tripping.  Move slowly when changing positions. Use deliberate, concentrated movements and, if needed, use a grab bar or walking aid. Count 15 seconds between each movement. For example, when rising from a seated position, wait 15 seconds after standing to begin walking.  If balance is a continuous problem, you might want to consider a walking aid such as a cane, walking stick, or walker. Once you've mastered walking with help, you might be ready to try it on your own again.

## 2022-10-15 DIAGNOSIS — S92514A Nondisplaced fracture of proximal phalanx of right lesser toe(s), initial encounter for closed fracture: Secondary | ICD-10-CM | POA: Diagnosis not present

## 2022-10-28 DIAGNOSIS — M8589 Other specified disorders of bone density and structure, multiple sites: Secondary | ICD-10-CM | POA: Diagnosis not present

## 2022-11-28 DIAGNOSIS — S92514A Nondisplaced fracture of proximal phalanx of right lesser toe(s), initial encounter for closed fracture: Secondary | ICD-10-CM | POA: Diagnosis not present

## 2022-12-05 DIAGNOSIS — H04123 Dry eye syndrome of bilateral lacrimal glands: Secondary | ICD-10-CM | POA: Diagnosis not present

## 2022-12-30 ENCOUNTER — Telehealth: Payer: Self-pay | Admitting: Pulmonary Disease

## 2022-12-30 NOTE — Telephone Encounter (Signed)
PT's Cpap says "Motor Life Exceeded". States she needs an RX sent in for a new one. [facility] is [facility].  Pls call to advise @ [phone]

## 2022-12-30 NOTE — Telephone Encounter (Signed)
Appt scheduled pt needs urgent cpap sent to [facility] has gone 3 nights without cpap

## 2022-12-31 NOTE — Telephone Encounter (Signed)
ATC X1 LVM for patient to call the office back 

## 2022-12-31 NOTE — Telephone Encounter (Signed)
Spoke with pt and reviewed Dr. Elsworth Soho response with her. She stated understanding. Nothing further needed at this time.

## 2023-01-22 ENCOUNTER — Telehealth: Payer: Self-pay | Admitting: Pulmonary Disease

## 2023-01-22 NOTE — Telephone Encounter (Signed)
Patient called to inform the doctor that her cpap life span ended on her last night.  She has an appt. On 4/30 and wanted to know if she should wait that long.  Please advise and call patient to discuss further.  CB# [phone]

## 2023-01-22 NOTE — Telephone Encounter (Signed)
RA, please advise.  

## 2023-01-24 NOTE — Telephone Encounter (Signed)
ATC patient. LVMTCB. 

## 2023-01-28 ENCOUNTER — Encounter (HOSPITAL_BASED_OUTPATIENT_CLINIC_OR_DEPARTMENT_OTHER): Payer: Self-pay | Admitting: Pulmonary Disease

## 2023-01-28 ENCOUNTER — Ambulatory Visit (HOSPITAL_BASED_OUTPATIENT_CLINIC_OR_DEPARTMENT_OTHER): Payer: Medicare PPO | Admitting: Pulmonary Disease

## 2023-01-28 VITALS — BP 140/90 | HR 89 | Ht 66.5 in | Wt 192.2 lb

## 2023-01-28 DIAGNOSIS — G4733 Obstructive sleep apnea (adult) (pediatric): Secondary | ICD-10-CM | POA: Diagnosis not present

## 2023-01-28 NOTE — Progress Notes (Signed)
   Subjective:    Patient ID: Christie Harris, female    DOB: [birth year], 82 y.o.   MRN: [id number]  HPI  82 year old woman for follow-up of OSA PMH -  DM2 c/b neuropathy, HLD, anxiety, depression, OSA (on CPAP), OA, hearing loss, GERD    She had a sleep study with [facility] many years ago.  We have been unable to track this.  She was maintained on CPAP of 13 cm. Her machine was giving her a warning for a few months and it finally stopped working a week ago.  She has been without the machine for a week.  She would like a replacement We reviewed home sleep study results.  She reports choking and gasping episodes in her sleep and nonrefreshing sleep She has lost 20 pounds over the last few years.  Blood pressure is borderline high  Significant tests/ events reviewed  08/2022 HST showed very mild OSA with AHI 6/ hr   Review of Systems neg for any significant sore throat, dysphagia, itching, sneezing, nasal congestion or excess/ purulent secretions, fever, chills, sweats, unintended wt loss, pleuritic or exertional cp, hempoptysis, orthopnea pnd or change in chronic leg swelling. Also denies presyncope, palpitations, heartburn, abdominal pain, nausea, vomiting, diarrhea or change in bowel or urinary habits, dysuria,hematuria, rash, arthralgias, visual complaints, headache, numbness weakness or ataxia.     Objective:   Physical Exam  Gen. Pleasant, obese, in no distress ENT - no lesions, no post nasal drip Neck: No JVD, no thyromegaly, no carotid bruits Lungs: no use of accessory muscles, no dullness to percussion, decreased without rales or rhonchi  Cardiovascular: Rhythm regular, heart sounds  normal, no murmurs or gallops, no peripheral edema Musculoskeletal: No deformities, no cyanosis or clubbing , no tremors       Assessment & Plan:

## 2023-01-28 NOTE — Addendum Note (Signed)
Addended by: Geraldo Docker on: 01/28/2023 10:00 AM   Modules accepted: Orders

## 2023-01-28 NOTE — Patient Instructions (Addendum)
X Rx for autoCPAP 8-13 cm  Trial fo airfit F30 small mask

## 2023-01-28 NOTE — Assessment & Plan Note (Signed)
She would be eligible for replacement machine.  Her previous machine was set at 13 cm we will provide her with auto CPAP 8 to 13 cm. She currently has an AirFit F20 mask and we discussed alternative masks including F30 and F40 which she is willing to try. We discussed sleep study and even though this only shows mild sleep apnea, she is symptomatic with nonrefreshing sleep and would benefit from CPAP therapy Weight loss encouraged, compliance with goal of at least 4-6 hrs every night is the expectation. Advised against medications with sedative side effects Cautioned against driving when sleepy - understanding that sleepiness will vary on a day to day basis

## 2023-02-03 DIAGNOSIS — E1169 Type 2 diabetes mellitus with other specified complication: Secondary | ICD-10-CM | POA: Diagnosis not present

## 2023-02-03 DIAGNOSIS — E7800 Pure hypercholesterolemia, unspecified: Secondary | ICD-10-CM | POA: Diagnosis not present

## 2023-02-03 DIAGNOSIS — F3341 Major depressive disorder, recurrent, in partial remission: Secondary | ICD-10-CM | POA: Diagnosis not present

## 2023-02-03 DIAGNOSIS — E119 Type 2 diabetes mellitus without complications: Secondary | ICD-10-CM | POA: Diagnosis not present

## 2023-02-03 DIAGNOSIS — R5383 Other fatigue: Secondary | ICD-10-CM | POA: Diagnosis not present

## 2023-02-03 DIAGNOSIS — R42 Dizziness and giddiness: Secondary | ICD-10-CM | POA: Diagnosis not present

## 2023-02-03 DIAGNOSIS — H9190 Unspecified hearing loss, unspecified ear: Secondary | ICD-10-CM | POA: Diagnosis not present

## 2023-02-14 DIAGNOSIS — G4733 Obstructive sleep apnea (adult) (pediatric): Secondary | ICD-10-CM | POA: Diagnosis not present

## 2023-02-27 DIAGNOSIS — M79671 Pain in right foot: Secondary | ICD-10-CM | POA: Diagnosis not present

## 2023-02-27 DIAGNOSIS — S92514A Nondisplaced fracture of proximal phalanx of right lesser toe(s), initial encounter for closed fracture: Secondary | ICD-10-CM | POA: Diagnosis not present

## 2023-02-27 DIAGNOSIS — M25562 Pain in left knee: Secondary | ICD-10-CM | POA: Diagnosis not present

## 2023-03-17 DIAGNOSIS — G4733 Obstructive sleep apnea (adult) (pediatric): Secondary | ICD-10-CM | POA: Diagnosis not present

## 2023-03-18 NOTE — Progress Notes (Signed)
NEUROLOGY FOLLOW UP OFFICE NOTE  [name] [id number]  Subjective:  Christie Harris is a 82 y.o. year old right-handed female with a medical history of DM2 c/b neuropathy, HLD, anxiety, depression, OSA (on CPAP), OA, hearing loss, GERD who we last saw on 09/18/22.  To briefly review:  Initial consult 06/12/22: Gait imbalance with falls x2 this weekend -This has been going on for about 1 year. -She describes walking, then staggering. She mentions that she walks funny. She feels like her feet are close to the ground and shuffling. She denies feeling stuck.  -She describes dizzy vs lightheaded. She feels like her head is not attached to her body. She does not think her body can do what she is telling it to do. She even finds it difficult to turn in bed. -For the first 2 hours every morning her hands shake and she is more likely to have imbalance than later in the day. She thinks the shaking is when she is moving and when staying still. She mentions reaching for her glasses in the morning and her hand shaking very badly when reaching for them. -She states that it is hard to get in cars and that she tends to walk behind everyone else (can't keep up). -She sleeps alone, but does not think she is acting out her dreams or having nightmares. She wakes up feeling rested.   Dizzy, hot, shaky, weak feeling episodes. -She describes acute attacks that come on suddenly -This has been going on around 10 years. She was standing at the time. It lasted a few minutes then resolved. It was very infrequent at that time (maybe yearly). -The episodes became more frequent over the last 2 years. Patient goes to the gym am then goes to [employer] directly afterwards. She eats before going to the gym, but does not drink water well. She thinks she tends to be dehydrated per her account. She tends to have episodes while grocery shopping. -She can have episodes at home at well. She had an episode she remembers  when she was picking up dishes. -The episodes still last a few minutes, but now occur about once per week.  -She does not know what helps, but it resolves on its own. She isn't sure that eating something helps or if sitting helps.    Pain in back of head -Patient has had this for about a decade. She describes it as a throbbing pain that is located on the back of her head and neck. It may be a little more on the right side. She has the pain daily. Leaning or laying on something makes it worse. Even a pillow causes discomfort. Her CPAP runs behind her head but she does not think things are better if she does not have it on her. She endorses that the CPAP strap is tight. She also mentions she needs new equipment. -She rates the pain as 7/10. It happens more at night but does not seem to be worse with position. -She previously followed with Dr Armanda Magic for her CPAP (last seen in 2014). She does not think she has anyone following this currently.   Vision loss -Patient had cataract surgery on her left eye at the beginning of the COVID pandemic. She has felt her vision has been poor since. -1 year ago at her eye exam she was told there was no problems with her eyes. -She has an appointment next week for her annual check up. She thinks she  sees an optometrist. -She endorses double vision with one line of text angling off. Her vision is very blurry. It is constant and does not fluctuate, but there may be worse symptoms at night. -Closing one eye does not help.   Patient was seen by her PCP, Dr. Nehemiah Settle on 03/14/22. She mentioned dizziness with change of position. Orthostatic vitals were negative. The dizziness then became more random and not associated with position change. She was off balance when walking. She also mentioned vision changes and trouble reading. Per notes, patient saw an ophthalmologist who found no problems with her eyes. Patient was referred to neurology for further  evaluation.  09/18/22: Patient's B12 was borderline low, so I recommended supplementation with B12 1000 mcg daily. Patient has been taking the B12 supplement. Labs were otherwise normal.   Patient had a recent fall when getting up to use to restroom at night. She hurt her right foot (broke 3 bones).   She went to PT. This improved her neck range of motion and neck pain modestly.    Patient had her vision checked and only found to have dry eyes. She sees improvement when she uses her eye drops.  Patient did her sleep study last week. She got the report yesterday. She had mild OSA. She is continuing her CPAP.  Most recent Assessment and Plan (09/18/22): This is Christie Harris, a 82 y.o. female with neck and posterior head pain, gait imbalance/dizziness, and vision changes. Patient has improved modestly per her report with B12 supplement and PT. She follows the orthostatic hypotension recommendations, but did have one fall when getting up to use the bathroom in the middle of the night and broke her right foot. Her eyes have improved with eye drops as she was diagnosed with dry eyes.   Plan: -Recheck B12 today -Continue B12 1000 mcg daily -Continue home PT exercises -Continue orthostatic hypotension recommendations -Continue eye drops for dry eyes  Since their last visit: Recheck of B12 was normal (515).  Her neck pain is greatly improved. It is much better than prior. She now has exercises as needed.  Patient has not had any falls since last visit. She thinks her dizziness has improved. She has less frequent episodes of shakiness, weakness, and sweaty. It still occurs, but less often than once per week which it was at our initial consult. It is only occurring when she goes to [employer]. She goes to sit down and it will improve (usually in 10 minutes, but once 30 minutes). The last one was 3 weeks.  She is having trouble with her right eye. She is having darker vision in her right eye  compared to left. She has not spoke to her eye doctor about this.  She is also having left knee pain. She is seeing ortho next week for this.    MEDICATIONS:  Outpatient Encounter Medications as of 03/21/2023  Medication Sig   busPIRone (BUSPAR) 15 MG tablet Take 15 mg by mouth 2 (two) times daily.   calcium carbonate (OS-CAL) 600 MG TABS Take 600 mg by mouth daily.   citalopram (CELEXA) 20 MG tablet Take 20 mg by mouth every evening.   cyanocobalamin (VITAMIN B12) 1000 MCG tablet Take 1,000 mcg by mouth daily.   omeprazole (PRILOSEC) 20 MG capsule Take 20 mg by mouth every evening.   rosuvastatin (CRESTOR) 5 MG tablet Take 5 mg by mouth every evening.   No facility-administered encounter medications on file as of 03/21/2023.  PAST MEDICAL HISTORY: Past Medical History:  Diagnosis Date   Anxiety    Arthritis    greater left hip   CA of skin    squamous cell right leg-recent excision   Depression    Depression    GERD (gastroesophageal reflux disease)    Hypercholesteremia    Obesity    Osteoarthrosis    Osteoporosis    Shortness of breath    on exertion   Sleep apnea    CPAP at 13cm H2o   Sleep apnea     PAST SURGICAL HISTORY: Past Surgical History:  Procedure Laterality Date   ABDOMINAL HYSTERECTOMY     uterus and ovaries   CATARACT EXTRACTION Right    CHOLECYSTECTOMY     COLONOSCOPY WITH PROPOFOL N/A 03/23/2013   Procedure: COLONOSCOPY WITH PROPOFOL;  Surgeon: Charolett Bumpers, MD;  Location: [facility];  Service: Endoscopy;  Laterality: N/A;colon polyps removed-benign   ESOPHAGOGASTRODUODENOSCOPY (EGD) WITH PROPOFOL N/A 06/16/2014   Procedure: ESOPHAGOGASTRODUODENOSCOPY (EGD) WITH PROPOFOL;  Surgeon: Charolett Bumpers, MD;  Location: [facility];  Service: Endoscopy;  Laterality: N/A;   plastic surgery on nose     "deviated septum and cosmetic"    ALLERGIES: Allergies  Allergen Reactions   Darvocet [Propoxyphene N-Acetaminophen] Other (See Comments)     "out of my mind"   Scallops [Shellfish Allergy] Nausea And Vomiting    FAMILY HISTORY: Family History  Problem Relation Age of Onset   Breast cancer Mother    Diabetes Father    Congestive Heart Failure Father    Hypertension Father    Lung cancer Sister    Heart attack Brother    Kidney disease Brother    Hypertension Brother    Kidney disease Brother     SOCIAL HISTORY: Social History   Tobacco Use   Smoking status: Never    Passive exposure: Past   Smokeless tobacco: Never  Vaping Use   Vaping Use: Never used  Substance Use Topics   Alcohol use: Yes    Comment: occ   Drug use: No   Social History   Social History Narrative   Right handed   Caffeine 1-2 mug in am 2 coke in pm    Live in a two story home with daughter      Objective:  Vital Signs:  BP (!) 118/53   Pulse 82   Ht 5' 6.5" (1.689 m)   Wt 190 lb (86.2 kg)   SpO2 96%   BMI 30.21 kg/m   General: No acute distress.  Patient appears well-groomed.   Head:  Normocephalic/atraumatic Neck: supple Lungs: Non-labored breathing on room air   Neurological Exam: Mental status: alert and oriented, speech fluent and not dysarthric, language intact.  Cranial nerves: CN I: not tested CN II: pupils equal, round and reactive to light, visual fields intact in left eye. Deficit in right lower quadrant in right eye CN III, IV, VI:  full range of motion, no nystagmus, no ptosis CN V: facial sensation intact. CN VII: upper and lower face symmetric CN VIII: hearing intact CN IX, X: uvula midline CN XI: sternocleidomastoid and trapezius muscles intact CN XII: tongue midline  Bulk & Tone: normal, no fasciculations. Motor:  muscle strength 5/5 throughout Deep Tendon Reflexes:  2+ throughout.   Sensation:  Pinprick sensation intact. Finger to nose testing:  Without dysmetria.   Gait:  Normal station and stride.    Labs and Imaging review: New results: B12 (09/18/22): 515  Previously reviewed  results: 06/12/22: TSH: 1.26 B12: 288 IFE and SPEP unremarkable MG panel (AChR binding abs, striated muscle abs) negative   External labs: Normal or unremarkable: CMP HbA1c (12/20/21): 6.7   CT head wo contrast (04/29/22): FINDINGS: Brain: No intracranial hemorrhage, mass effect, or evidence of acute infarct. No hydrocephalus. No extra-axial fluid collection. Mild cerebral volume loss. Ill-defined hypoattenuation within the cerebral white matter is nonspecific but consistent with chronic small vessel ischemic disease.   Vascular: No hyperdense vessel or unexpected calcification.   Skull: No fracture or focal lesion.   Sinuses/Orbits: No acute finding. Paranasal sinuses and mastoid air cells are well aerated.   Other: None.   IMPRESSION: No acute intracranial abnormality.   MRI cervical spine (12/23/2002): FINDINGS:  THERE IS REVERSAL OF THE NORMAL CERVICAL LORDOSIS CENTERED AT THE C5 LEVEL.  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  THE  PREVERTEBRAL SOFT TISSUES AND VISUALIZED PORTION OF THE POSTERIOR FOSSA ARE NORMAL.  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  C2-3:   NORMAL.  C3-4:   THERE IS A SMALL POSTERIOR DISK/OSTEOPHYTE COMPLEX WITH NO SUBSEQUENT CENTRAL CANAL  STENOSIS OR NEURAL FORAMINAL NARROWING.  C4-5, C5-6, AND C6-7:   THERE ARE MODERATE SIZED POSTERIOR DISK/OSTEOPHYTE COMPLEXES WHICH EFFACE  THE VENTRAL SUBARACHNOID SPACE AND FLATTEN THE VENTRAL ASPECT OF THE CORD.  NO SIGNIFICANT NEURAL  FORAMINAL NARROWING IS IDENTIFIED.  THERE IS NO ABNORMAL CORD SIGNAL.  C7-T1:   NORMAL.   IMPRESSION  1.  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  2.  THERE ARE MODERATE SIZED POSTERIOR DISK/OSTEOPHYTE COMPLEXES AT C4-5, C5-6, AND C6-7 WHICH RESULT IN MILD CENTRAL CANAL NARROWING;  HOWEVER, THERE IS NO ABNORMAL CORD SIGNAL AND NO SIGNIFICANT NEURAL FORAMINAL NARROWING BILATERALLY.  Assessment/Plan:  This is ZELAYA KULAS, a 82 y.o. female with: Right eye vision changes - Patient describes diffuse darkening of vision, but exam may be consistent with monocular right inferior quadrantanopia. Query BRAO or eye pathology. Dizziness and imbalance - improved, may be related to B12 deficiency below Episodes of sweatiness, shakiness, and weakness - only in [employer]. Improves with sitting. Sounds like hypotension or hypoglycemia, but unclear. This is also getting less frequent Vitamin B12 deficiency - patient taking B12 Fatigue - will check vit D today Right knee pain - will see ortho next week  Plan: -Blood work: Vit D -Continue B12 1000 mcg daily -Discussed orthostatic intolerance recommendations again -Discussed importance of hydration and nutrition to avoid hypoglycemia -Recommend patient discuss right vision being darker lately with eye doctor -Continue eye drops for dry eyes  Return to clinic in 6 months  Total time spent reviewing records, interview, history/exam, documentation, and coordination of care on day of encounter:  40 min  Jacquelyne Balint, MD

## 2023-03-21 ENCOUNTER — Other Ambulatory Visit (INDEPENDENT_AMBULATORY_CARE_PROVIDER_SITE_OTHER): Payer: Medicare PPO

## 2023-03-21 ENCOUNTER — Ambulatory Visit: Payer: Medicare PPO | Admitting: Neurology

## 2023-03-21 ENCOUNTER — Encounter: Payer: Self-pay | Admitting: Neurology

## 2023-03-21 VITALS — BP 118/53 | HR 82 | Ht 66.5 in | Wt 190.0 lb

## 2023-03-21 DIAGNOSIS — I951 Orthostatic hypotension: Secondary | ICD-10-CM

## 2023-03-21 DIAGNOSIS — H04123 Dry eye syndrome of bilateral lacrimal glands: Secondary | ICD-10-CM

## 2023-03-21 DIAGNOSIS — G4733 Obstructive sleep apnea (adult) (pediatric): Secondary | ICD-10-CM | POA: Diagnosis not present

## 2023-03-21 DIAGNOSIS — R2681 Unsteadiness on feet: Secondary | ICD-10-CM

## 2023-03-21 DIAGNOSIS — H547 Unspecified visual loss: Secondary | ICD-10-CM | POA: Diagnosis not present

## 2023-03-21 DIAGNOSIS — M542 Cervicalgia: Secondary | ICD-10-CM

## 2023-03-21 DIAGNOSIS — E538 Deficiency of other specified B group vitamins: Secondary | ICD-10-CM

## 2023-03-21 NOTE — Patient Instructions (Addendum)
I will get blood work to test vitamin D level today.  Continue B12 supplement.  Speak to your eye doctor about the vision problems in the right eye. They may do more formal visual field testing.  Continue eye drops.  I will see you back in clinic in 6 months. Please let me know if you have any questions or concerns in the meantime.   The physicians and staff at [facility] are committed to providing excellent care. You may receive a survey requesting feedback about your experience at our office. We strive to receive "very good" responses to the survey questions. If you feel that your experience would prevent you from giving the office a "very good " response, please contact our office to try to remedy the situation. We may be reached at [phone]. Thank you for taking the time out of your busy day to complete the survey.  Jacquelyne Balint, MD [facility]  Guidelines for the Nonpharmacological Treatment of Orthostatic Intolerance  Make all postural changes from lying to sitting or sitting to standing, slowly.  Drink 2.0-2.5 L of fluid per day (if okay with your other doctors).  Increase sodium in the diet to 3-5 g per day (if okay with your other doctors).  Avoid large meals which can cause low blood pressure during digestion. It is better to eat smaller meals more often than 3 large meals.  Avoid alcohol. Alcohol can cause blood to pool in the legs which may worsen low blood pressure reactions when standing.  Perform lower extremity exercises to improve strength of the leg muscles. This will help prevent blood from pooling in the legs when standing and walking. Raise the head of the bed by 6-10 inches. The entire bed must be at an angle. Raising only the head portion of the bed at the waist level or using pillows will not be effective. Raising the head of the bed will reduce urine formation overnight and there will be more volume in the circulation in the morning. It may also  help orthostatic tolerance during the day.  During bad days or prior to engaging in more physical activity than usual, drink 500 ml of water quickly. This will result in increased blood pressure within 5 minutes of drinking the water. The effect will last up to a few hours and may improve orthostatic intolerance.  Use custom-fitted elastic support stockings. This will reduce a tendency for blood to pool in the legs when standing and may improve orthostatic intolerance. Abdominal binder or "SPANX" may also be useful.  Use physical counter-maneuvers such as leg crossing, squatting, or raising and resting the leg on a chair. These maneuvers increase blood pressure and can improve orthostatic intolerance.  Gently escalated aerobic physical activity program for graded reconditioning (see details below)  Home-based Exercise Plan for Patients with Orthostatic Intolerance (as may be seen in POTS and related disorders)  Level 1 - Reclined Gentle Movements This is the starting point for those patients most severely disabled by an autonomic disorder. If you are bedridden, this is your first step. We have known dysautonomia patients who were bedridden for years, and we able to work their way from "barely able to move" to biking 45 minutes a day, everyday. It will be hard. It may make you feel worse in the beginning. But the human body was meant to move. Just take baby steps until you can make it to your goal. Some patients can only do one minute a day, or one minute  at a time, a few times a day. And they do this every day for a week, and then they increase to two minutes a day the next week. This is a very slow process, but improving your health slowly is better than not improving it at all. Helpful exercises may include:  Leg Pillow Squeeze - while laying down or reclined in bed, put a pillow folded between your knees and squeeze. Hold it for 10 seconds. Repeat.  Arm Pillow Squeeze - put the pillow folded  between your palms and squeeze together as though you were putting your hands into a praying position. Hold it for 10 seconds. Repeat.  Alphabet Toes - while laying in bed, write your name in the air with your toes. If you can build up your strength, write the whole alphabet. Do this several times a day.  Side Leg Lifts - while laying on your side, lift your leg up sideways and then bring your leg back down, without touching your legs together. Repeat.  Front Leg Lifts - While laying on your back, life your left leg up, pointing your toe towards the ceiling. Repeat. Switch to right leg.  Gentle Stretching - any kind of stretching helps move blood around in the body and takes stress of your joints if you have been sitting or laying in the same position for a long time. Go through the entire body doing mild stretches, from feet, to legs, to back, to arms, to neck. Doing this when you wake up can be a great way to start the day, and repeating your stretches before bed can help you relax and sleep better.  Level 2 - Recumbent Cardio Exercises This is probably the level that most patients will be able to begin with, although everyone can benefit from the gentle stretching and toning exercises described in Level 1.  Always begin your workout with 5-10 minutes of stretching and/or yoga to warm up your muscles and protect your joints from injury.  Since the point of these exercises is to get your cardiovascular system to be more efficient, you will want to set a target heart rate for your workout. You should speak to your doctor about this because medications and other medical conditions can impact your target heart rate, but most patients can tolerate a workout at 75% to 80% of their maximum heart rate. [facility] has a Target Heart Rate Calculator you can use as a guide when speaking with your doctor.  You may want to purchase an exercise heart rate monitor to wear during your workouts to help you keep  your heart rate within your target zone. We do not endorse any specific products, but exercise heart rate monitors with chest straps are usually more accurate than pulse oximeters you place on your finger. This is especially so for dysautonomia patients who have abnormalities in peripheral blood flow (common in some forms of dysautonomia), as this is more likely to give an inaccurate reading using a finger based monitor.  Suggested reclined cardiovascular exercises include:  Rowing - use a rowing machine, or if you are feeling well enough, a kayak. You may want to start out slow, maybe 2-5 minutes a day. At your own pace, adding a few minutes per week, try to work your way up to 45 minutes per day, 5 days a week, with 30 minutes of your routine done in your target heart rate zone. Be sure to warm up at the beginning and cool down at the  end.  Recumbent Biking - recumbent exercise bikes are different than regular exercise bikes. They seat the rider in a reclined position, rather than upright. Try recumbent biking a few minutes a day, adding a few minutes each week, until you can work out 45 minutes a day, five days a week, with 30 minutes of that workout in your target heart rate zone. Be sure to warm up at the beginning and cool down at the end of each workout.  Swimming - The pressure from water helps prevent orthostatic symptoms. Dysautonomia patients who have been bedridden for years may be able to stand upright for an hour in a pool, because the pressure from the water prevents orthostatic symptoms from occurring, or lessens their impact. Dysautonomia patients can take advantage of this to get a good cardio workout, or to focus on stretching and strength training in the water. Always swim with a spotter or a buddy who can keep and eye on you, just in case you develop lightheadedness or other symptoms that would make it unsafe to be in a pool. It may be best to start your swimming exercise program at a  pool with a lifeguard, or with a Physical Therapist who specializes in aquatic therapy. A good old fashioned kick board can be a great tool for dysautonomia patients. You can kick your way around the pool, which gives you a good cardio workout, and all that kicking helps strengthen your legs. Toning up your legs and core is a great way to minimize orthostatic symptoms.  Weight Training - Most dysautonomia patients can benefit from overall increase in tone and strength. The stronger our muscles are, the more efficiently they use oxygen, the better we will be able to tolerate orthostatic stress. Special emphasis can be placed on strengthening leg and core muscles. Some patients find it useful to wear 2 to 4 lb. weights that attach to the ankles with velcro. This can really help with leg strength if you wear them often enough. There are also machines at the gym that assist with core and leg weight training. Begin with light weights, and use them in a reclined or seated position. Some patients become extra symptomatic when lifting their arms over their head, so take extra care if attempting to do that.  Level 3 - Normal Workouts Some dysautonomia patients are able to jog, run marathons or walk several miles a week. These patients should do whatever they can to continue these activities. Dysautonomia patients who are well-conditioned should exercise 45 minutes a day, at least 3 days per week. Special emphasis should be placed on leg and core strength, and cardiovascular exercises.   Preventing Falls at [facility] are common, often dreaded events in the lives of older people. Aside from the obvious injuries and even death that may result, fall can cause wide-ranging consequences including loss of independence, mental decline, decreased activity and mobility. Younger people are also at risk of falling, especially those with chronic illnesses and fatigue.  Ways to reduce risk for falling Examine diet and  medications. Warm foods and alcohol dilate blood vessels, which can lead to dizziness when standing. Sleep aids, antidepressants and pain medications can also increase the likelihood of a fall.  Get a vision exam. Poor vision, cataracts and glaucoma increase the chances of falling.  Check foot gear. Shoes should fit snugly and have a sturdy, nonskid sole and a broad, low heel  Participate in a physician-approved exercise program to build and maintain muscle  strength and improve balance and coordination. Programs that use ankle weights or stretch bands are excellent for muscle-strengthening. Water aerobics programs and low-impact Tai Chi programs have also been shown to improve balance and coordination.  Increase vitamin D intake. Vitamin D improves muscle strength and increases the amount of calcium the body is able to absorb and deposit in bones.  How to prevent falls from common hazards Floors - Remove all loose wires, cords, and throw rugs. Minimize clutter. Make sure rugs are anchored and smooth. Keep furniture in its usual place.  Chairs -- Use chairs with straight backs, armrests and firm seats. Add firm cushions to existing pieces to add height.  Bathroom - Install grab bars and non-skid tape in the tub or shower. Use a bathtub transfer bench or a shower chair with a back support Use an elevated toilet seat and/or safety rails to assist standing from a low surface. Do not use towel racks or bathroom tissue holders to help you stand.  Lighting - Make sure halls, stairways, and entrances are well-lit. Install a night light in your bathroom or hallway. Make sure there is a light switch at the top and bottom of the staircase. Turn lights on if you get up in the middle of the night. Make sure lamps or light switches are within reach of the bed if you have to get up during the night.  Kitchen - Install non-skid rubber mats near the sink and stove. Clean spills immediately. Store frequently used  utensils, pots, pans between waist and eye level. This helps prevent reaching and bending. Sit when getting things out of lower cupboards.  Living room/ Bedrooms - Place furniture with wide spaces in between, giving enough room to move around. Establish a route through the living room that gives you something to hold onto as you walk.  Stairs - Make sure treads, rails, and rugs are secure. Install a rail on both sides of the stairs. If stairs are a threat, it might be helpful to arrange most of your activities on the lower level to reduce the number of times you must climb the stairs.  Entrances and doorways - Install metal handles on the walls adjacent to the doorknobs of all doors to make it more secure as you travel through the doorway.  Tips for maintaining balance Keep at least one hand free at all times. Try using a backpack or fanny pack to hold things rather than carrying them in your hands. Never carry objects in both hands when walking as this interferes with keeping your balance.  Attempt to swing both arms from front to back while walking. This might require a conscious effort if Parkinson's disease has diminished your movement. It will, however, help you to maintain balance and posture, and reduce fatigue.  Consciously lift your feet off of the ground when walking. Shuffling and dragging of the feet is a common culprit in losing your balance.  When trying to navigate turns, use a "U" technique of facing forward and making a wide turn, rather than pivoting sharply.  Try to stand with your feet shoulder-length apart. When your feet are close together for any length of time, you increase your risk of losing your balance and falling.  Do one thing at a time. Don't try to walk and accomplish another task, such as reading or looking around. The decrease in your automatic reflexes complicates motor function, so the less distraction, the better.  Do not wear rubber or gripping soled shoes,  they might "catch" on the floor and cause tripping.  Move slowly when changing positions. Use deliberate, concentrated movements and, if needed, use a grab bar or walking aid. Count 15 seconds between each movement. For example, when rising from a seated position, wait 15 seconds after standing to begin walking.  If balance is a continuous problem, you might want to consider a walking aid such as a cane, walking stick, or walker. Once you've mastered walking with help, you might be ready to try it on your own again.

## 2023-03-22 LAB — VITAMIN D 25 HYDROXY (VIT D DEFICIENCY, FRACTURES): Vit D, 25-Hydroxy: 40.9 ng/mL (ref 30.0–100.0)

## 2023-04-02 DIAGNOSIS — M1712 Unilateral primary osteoarthritis, left knee: Secondary | ICD-10-CM | POA: Diagnosis not present

## 2023-04-14 DIAGNOSIS — D2262 Melanocytic nevi of left upper limb, including shoulder: Secondary | ICD-10-CM | POA: Diagnosis not present

## 2023-04-14 DIAGNOSIS — D2239 Melanocytic nevi of other parts of face: Secondary | ICD-10-CM | POA: Diagnosis not present

## 2023-04-14 DIAGNOSIS — D485 Neoplasm of uncertain behavior of skin: Secondary | ICD-10-CM | POA: Diagnosis not present

## 2023-04-14 DIAGNOSIS — D225 Melanocytic nevi of trunk: Secondary | ICD-10-CM | POA: Diagnosis not present

## 2023-04-14 DIAGNOSIS — L720 Epidermal cyst: Secondary | ICD-10-CM | POA: Diagnosis not present

## 2023-04-14 DIAGNOSIS — Z85828 Personal history of other malignant neoplasm of skin: Secondary | ICD-10-CM | POA: Diagnosis not present

## 2023-04-14 DIAGNOSIS — L7211 Pilar cyst: Secondary | ICD-10-CM | POA: Diagnosis not present

## 2023-04-14 DIAGNOSIS — L570 Actinic keratosis: Secondary | ICD-10-CM | POA: Diagnosis not present

## 2023-04-14 DIAGNOSIS — L821 Other seborrheic keratosis: Secondary | ICD-10-CM | POA: Diagnosis not present

## 2023-04-14 DIAGNOSIS — C4441 Basal cell carcinoma of skin of scalp and neck: Secondary | ICD-10-CM | POA: Diagnosis not present

## 2023-04-14 DIAGNOSIS — L814 Other melanin hyperpigmentation: Secondary | ICD-10-CM | POA: Diagnosis not present

## 2023-04-16 DIAGNOSIS — G4733 Obstructive sleep apnea (adult) (pediatric): Secondary | ICD-10-CM | POA: Diagnosis not present

## 2023-05-12 DIAGNOSIS — G4733 Obstructive sleep apnea (adult) (pediatric): Secondary | ICD-10-CM | POA: Diagnosis not present

## 2023-05-14 DIAGNOSIS — M25562 Pain in left knee: Secondary | ICD-10-CM | POA: Diagnosis not present

## 2023-05-17 DIAGNOSIS — G4733 Obstructive sleep apnea (adult) (pediatric): Secondary | ICD-10-CM | POA: Diagnosis not present

## 2023-05-21 DIAGNOSIS — C4441 Basal cell carcinoma of skin of scalp and neck: Secondary | ICD-10-CM | POA: Diagnosis not present

## 2023-06-10 DIAGNOSIS — H18593 Other hereditary corneal dystrophies, bilateral: Secondary | ICD-10-CM | POA: Diagnosis not present

## 2023-06-10 DIAGNOSIS — E119 Type 2 diabetes mellitus without complications: Secondary | ICD-10-CM | POA: Diagnosis not present

## 2023-06-10 DIAGNOSIS — H16223 Keratoconjunctivitis sicca, not specified as Sjogren's, bilateral: Secondary | ICD-10-CM | POA: Diagnosis not present

## 2023-06-17 DIAGNOSIS — G4733 Obstructive sleep apnea (adult) (pediatric): Secondary | ICD-10-CM | POA: Diagnosis not present

## 2023-07-08 ENCOUNTER — Other Ambulatory Visit: Payer: Self-pay | Admitting: Internal Medicine

## 2023-07-08 ENCOUNTER — Ambulatory Visit
Admission: RE | Admit: 2023-07-08 | Discharge: 2023-07-08 | Disposition: A | Discharge status: Home / Self Care | Payer: Medicare PPO | Source: Ambulatory Visit | Attending: Internal Medicine | Admitting: Internal Medicine

## 2023-07-08 DIAGNOSIS — M542 Cervicalgia: Secondary | ICD-10-CM

## 2023-07-08 DIAGNOSIS — E1142 Type 2 diabetes mellitus with diabetic polyneuropathy: Secondary | ICD-10-CM | POA: Diagnosis not present

## 2023-07-08 DIAGNOSIS — M545 Low back pain, unspecified: Secondary | ICD-10-CM

## 2023-07-08 DIAGNOSIS — R0789 Other chest pain: Secondary | ICD-10-CM

## 2023-07-08 DIAGNOSIS — G8929 Other chronic pain: Secondary | ICD-10-CM | POA: Diagnosis not present

## 2023-07-08 DIAGNOSIS — R350 Frequency of micturition: Secondary | ICD-10-CM | POA: Diagnosis not present

## 2023-07-08 DIAGNOSIS — M25512 Pain in left shoulder: Secondary | ICD-10-CM | POA: Diagnosis not present

## 2023-07-08 DIAGNOSIS — R079 Chest pain, unspecified: Secondary | ICD-10-CM | POA: Diagnosis not present

## 2023-07-08 DIAGNOSIS — Z9181 History of falling: Secondary | ICD-10-CM | POA: Diagnosis not present

## 2023-07-17 DIAGNOSIS — G4733 Obstructive sleep apnea (adult) (pediatric): Secondary | ICD-10-CM | POA: Diagnosis not present

## 2023-08-11 DIAGNOSIS — E1169 Type 2 diabetes mellitus with other specified complication: Secondary | ICD-10-CM | POA: Diagnosis not present

## 2023-08-11 DIAGNOSIS — Z1331 Encounter for screening for depression: Secondary | ICD-10-CM | POA: Diagnosis not present

## 2023-08-11 DIAGNOSIS — G4733 Obstructive sleep apnea (adult) (pediatric): Secondary | ICD-10-CM | POA: Diagnosis not present

## 2023-08-11 DIAGNOSIS — E119 Type 2 diabetes mellitus without complications: Secondary | ICD-10-CM | POA: Diagnosis not present

## 2023-08-11 DIAGNOSIS — F3341 Major depressive disorder, recurrent, in partial remission: Secondary | ICD-10-CM | POA: Diagnosis not present

## 2023-08-11 DIAGNOSIS — E7800 Pure hypercholesterolemia, unspecified: Secondary | ICD-10-CM | POA: Diagnosis not present

## 2023-08-11 DIAGNOSIS — M810 Age-related osteoporosis without current pathological fracture: Secondary | ICD-10-CM | POA: Diagnosis not present

## 2023-08-11 DIAGNOSIS — Z9181 History of falling: Secondary | ICD-10-CM | POA: Diagnosis not present

## 2023-08-11 DIAGNOSIS — H9190 Unspecified hearing loss, unspecified ear: Secondary | ICD-10-CM | POA: Diagnosis not present

## 2023-08-11 DIAGNOSIS — F419 Anxiety disorder, unspecified: Secondary | ICD-10-CM | POA: Diagnosis not present

## 2023-08-11 DIAGNOSIS — Z0000 Encounter for general adult medical examination without abnormal findings: Secondary | ICD-10-CM | POA: Diagnosis not present

## 2023-08-14 DIAGNOSIS — M545 Low back pain, unspecified: Secondary | ICD-10-CM | POA: Diagnosis not present

## 2023-08-17 DIAGNOSIS — G4733 Obstructive sleep apnea (adult) (pediatric): Secondary | ICD-10-CM | POA: Diagnosis not present

## 2023-09-02 DIAGNOSIS — Z1231 Encounter for screening mammogram for malignant neoplasm of breast: Secondary | ICD-10-CM | POA: Diagnosis not present

## 2023-09-02 NOTE — Progress Notes (Signed)
NEUROLOGY FOLLOW UP OFFICE NOTE  Christie Harris [id number]  Subjective:  Christie Harris is a 82 y.o. year old right-handed female with a medical history of DM2 c/b neuropathy, HLD, anxiety, depression, OSA (on CPAP), OA, hearing loss, GERD who we last saw on 03/21/23 for right eye vision changes, dizziness and imbalance, episodes of sweatiness, shakiness, and weakness, B12 deficiency, fatigue.  To briefly review: Initial consult 06/12/22: Gait imbalance with falls x2 this weekend -This has been going on for about 1 year. -She describes walking, then staggering. She mentions that she walks funny. She feels like her feet are close to the ground and shuffling. She denies feeling stuck.  -She describes dizzy vs lightheaded. She feels like her head is not attached to her body. She does not think her body can do what she is telling it to do. She even finds it difficult to turn in bed. -For the first 2 hours every morning her hands shake and she is more likely to have imbalance than later in the day. She thinks the shaking is when she is moving and when staying still. She mentions reaching for her glasses in the morning and her hand shaking very badly when reaching for them. -She states that it is hard to get in cars and that she tends to walk behind everyone else (can't keep up). -She sleeps alone, but does not think she is acting out her dreams or having nightmares. She wakes up feeling rested.   Dizzy, hot, shaky, weak feeling episodes. -She describes acute attacks that come on suddenly -This has been going on around 10 years. She was standing at the time. It lasted a few minutes then resolved. It was very infrequent at that time (maybe yearly). -The episodes became more frequent over the last 2 years. Patient goes to the gym am then goes to [employer] directly afterwards. She eats before going to the gym, but does not drink water well. She thinks she tends to be dehydrated per her account.  She tends to have episodes while grocery shopping. -She can have episodes at home at well. She had an episode she remembers when she was picking up dishes. -The episodes still last a few minutes, but now occur about once per week.  -She does not know what helps, but it resolves on its own. She isn't sure that eating something helps or if sitting helps.    Pain in back of head -Patient has had this for about a decade. She describes it as a throbbing pain that is located on the back of her head and neck. It may be a little more on the right side. She has the pain daily. Leaning or laying on something makes it worse. Even a pillow causes discomfort. Her CPAP runs behind her head but she does not think things are better if she does not have it on her. She endorses that the CPAP strap is tight. She also mentions she needs new equipment. -She rates the pain as 7/10. It happens more at night but does not seem to be worse with position. -She previously followed with Christie Harris for her CPAP (last seen in 2014). She does not think she has anyone following this currently.   Vision loss -Patient had cataract surgery on her left eye at the beginning of the COVID pandemic. She has felt her vision has been poor since. -1 year ago at her eye exam she was told there was no problems with  her eyes. -She has an appointment next week for her annual check up. She thinks she sees an optometrist. -She endorses double vision with one line of text angling off. Her vision is very blurry. It is constant and does not fluctuate, but there may be worse symptoms at night. -Closing one eye does not help.   Patient was seen by her PCP, Christie Harris on 03/14/22. She mentioned dizziness with change of position. Orthostatic vitals were negative. The dizziness then became more random and not associated with position change. She was off balance when walking. She also mentioned vision changes and trouble reading. Per notes, patient saw  an ophthalmologist who found no problems with her eyes. Patient was referred to neurology for further evaluation.   09/18/22: Patient's B12 was borderline low, so I recommended supplementation with B12 1000 mcg daily. Patient has been taking the B12 supplement. Labs were otherwise normal.   Patient had a recent fall when getting up to use to restroom at night. She hurt her right foot (broke 3 bones).   She went to PT. This improved her neck range of motion and neck pain modestly.    Patient had her vision checked and only found to have dry eyes. She sees improvement when she uses her eye drops.  Patient did her sleep study last week. She got the report yesterday. She had mild OSA. She is continuing her CPAP.  03/21/23: Recheck of B12 was normal (515).   Her neck pain is greatly improved. It is much better than prior. She now has exercises as needed.   Patient has not had any falls since last visit. She thinks her dizziness has improved. She has less frequent episodes of shakiness, weakness, and sweaty. It still occurs, but less often than once per week which it was at our initial consult. It is only occurring when she goes to [employer]. She goes to sit down and it will improve (usually in 10 minutes, but once 30 minutes). The last one was 3 weeks.   She is having trouble with her right eye. She is having darker vision in her right eye compared to left. She has not spoke to her eye doctor about this.   She is also having left knee pain. She is seeing ortho next week for this.    Most recent Assessment and Plan (03/21/23): This is Christie Harris, a 82 y.o. female with: Right eye vision changes - Patient describes diffuse darkening of vision, but exam may be consistent with monocular right inferior quadrantanopia. Query BRAO or eye pathology. Dizziness and imbalance - improved, may be related to B12 deficiency below Episodes of sweatiness, shakiness, and weakness - only in [employer].  Improves with sitting. Sounds like hypotension or hypoglycemia, but unclear. This is also getting less frequent Vitamin B12 deficiency - patient taking B12 Fatigue - will check vit D today Right knee pain - will see ortho next week   Plan: -Blood work: Vit D -Continue B12 1000 mcg daily -Discussed orthostatic intolerance recommendations again -Discussed importance of hydration and nutrition to avoid hypoglycemia -Recommend patient discuss right vision being darker lately with eye doctor -Continue eye drops for dry eyes  Since their last visit: Vit D was normal. She is still taking B12. She has some dizziness and imbalance, but she thinks this is about the same as prior. She has had 2 minor "half falls" since last visit, like tripping over a chair.  Patient still feels like she sees poorly.  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 She was given eye drops that helped a lot. She thinks this has improved her vision.  She still gets episodes of sweatiness and shakiness and weakness, usually in [employer], about once per month. She will sit down and improve. She has not had one in awhile.  She has a new CPAP mask that she likes much more. She still does not sleep well though because she wakes up frequently to go to the bathroom.  She has no new complaints.  MEDICATIONS:  Outpatient Encounter Medications as of 09/10/2023  Medication Sig   busPIRone (BUSPAR) 15 MG tablet Take 15 mg by mouth 2 (two) times daily.   calcium carbonate (OS-CAL) 600 MG TABS Take 600 mg by mouth daily.   citalopram (CELEXA) 20 MG tablet Take 20 mg by mouth every evening.   cyanocobalamin (VITAMIN B12) 1000 MCG tablet Take 1,000 mcg by mouth daily.   meloxicam (MOBIC) 15 MG tablet Take 15 mg by mouth daily.   omeprazole (PRILOSEC) 20 MG capsule Take 20 mg by mouth every evening.   rosuvastatin (CRESTOR) 5 MG tablet Take 5 mg by mouth every evening.   No facility-administered encounter  medications on file as of 09/10/2023.    PAST MEDICAL HISTORY: Past Medical History:  Diagnosis Date   Anxiety    Arthritis    greater left hip   CA of skin    squamous cell right leg-recent excision   Depression    Depression    GERD (gastroesophageal reflux disease)    Hypercholesteremia    Obesity    Osteoarthrosis    Osteoporosis    Shortness of breath    on exertion   Sleep apnea    CPAP at 13cm H2o   Sleep apnea     PAST SURGICAL HISTORY: Past Surgical History:  Procedure Laterality Date   ABDOMINAL HYSTERECTOMY     uterus and ovaries   CATARACT EXTRACTION Right    CHOLECYSTECTOMY     COLONOSCOPY WITH PROPOFOL N/A 03/23/2013   Procedure: COLONOSCOPY WITH PROPOFOL;  Surgeon: Charolett Bumpers, MD;  Location: [facility];  Service: Endoscopy;  Laterality: N/A;colon polyps removed-benign   ESOPHAGOGASTRODUODENOSCOPY (EGD) WITH PROPOFOL N/A 06/16/2014   Procedure: ESOPHAGOGASTRODUODENOSCOPY (EGD) WITH PROPOFOL;  Surgeon: Charolett Bumpers, MD;  Location: [facility];  Service: Endoscopy;  Laterality: N/A;   plastic surgery on nose     "deviated septum and cosmetic"    ALLERGIES: Allergies  Allergen Reactions   Darvocet [Propoxyphene N-Acetaminophen] Other (See Comments)    "out of my mind"   Scallops [Shellfish Allergy] Nausea And Vomiting    FAMILY HISTORY: Family History  Problem Relation Age of Onset   Breast cancer Mother    Diabetes Father    Congestive Heart Failure Father    Hypertension Father    Lung cancer Sister    Heart attack Brother    Kidney disease Brother    Hypertension Brother    Kidney disease Brother     SOCIAL HISTORY: Social History   Tobacco Use   Smoking status: Never    Passive exposure: Past   Smokeless tobacco: Never  Vaping Use   Vaping status: Never Used  Substance Use Topics   Alcohol use: Yes    Comment: occ   Drug use: No   Social History   Social History Narrative   Right handed   Caffeine 1-2 mug in am  2 coke in pm  Live in a two story home with daughter      Objective:  Vital Signs:  BP 126/84   Pulse 77   Ht 5' 6.5" (1.689 m)   Wt 190 lb (86.2 kg)   SpO2 97%   BMI 30.21 kg/m   General: No acute distress.  Patient appears well-groomed.   Head:  Normocephalic/atraumatic Neck: supple Lungs: Non-labored breathing on room air.  Neurological Exam: Mental status: alert and oriented, speech fluent and not dysarthric, language intact.  Cranial nerves: CN I: not tested CN II: pupils equal, round and reactive to light, visual fields intact CN III, IV, VI:  full range of motion, no nystagmus, no ptosis CN V: facial sensation intact. CN VII: upper and lower face symmetric CN VIII: hearing intact CN IX, X: uvula midline CN XI: sternocleidomastoid and trapezius muscles intact CN XII: tongue midline  Bulk & Tone: normal. Motor:  muscle strength 5/5 throughout Deep Tendon Reflexes:  2-3+ throughout.   Sensation:  Pinprick sensation intact. Finger to nose testing:  Without dysmetria.   Gait:  Normal station and stride.  Labs and Imaging review: New results: Vit D (03/21/23) wnl  Cervical spine xray (07/08/23): FINDINGS: No fracture, dislocation or subluxation. No spondylolisthesis. No osteolytic or osteoblastic changes. Prevertebral and cervical cranial soft tissues are unremarkable.   Degenerative disc disease noted with disc space narrowing and marginal osteophytes at C3 through C7. Osteoarthritis at C1-C2.   IMPRESSION: Degenerative changes. No acute osseous abnormalities.  Lumbar spine xray (07/08/23): FINDINGS: There is no evidence of lumbar spine fracture. Alignment is normal. Degenerative changes at each lumbar level with disc space narrowing and marginal osteophytes. Facet joint degenerative changes L4-5 and L5-S1. Aortoiliac atheromatous calcifications noted.   IMPRESSION: Degenerative changes. No acute osseous abnormalities.  Previously reviewed  results: B12 (09/18/22): 515   06/12/22: TSH: 1.26 B12: 288 IFE and SPEP unremarkable MG panel (AChR binding abs, striated muscle abs) negative   External labs: Normal or unremarkable: CMP HbA1c (12/20/21): 6.7   CT head wo contrast (04/29/22): FINDINGS: Brain: No intracranial hemorrhage, mass effect, or evidence of acute infarct. No hydrocephalus. No extra-axial fluid collection. Mild cerebral volume loss. Ill-defined hypoattenuation within the cerebral white matter is nonspecific but consistent with chronic small vessel ischemic disease.   Vascular: No hyperdense vessel or unexpected calcification.   Skull: No fracture or focal lesion.   Sinuses/Orbits: No acute finding. Paranasal sinuses and mastoid air cells are well aerated.   Other: None.   IMPRESSION: No acute intracranial abnormality.   MRI cervical spine (12/23/2002): FINDINGS:  THERE IS REVERSAL OF THE NORMAL CERVICAL LORDOSIS CENTERED AT THE C5 LEVEL.  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  THE  PREVERTEBRAL SOFT TISSUES AND VISUALIZED PORTION OF THE POSTERIOR FOSSA ARE NORMAL.  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  C2-3:   NORMAL.  C3-4:   THERE IS A SMALL POSTERIOR DISK/OSTEOPHYTE COMPLEX WITH NO SUBSEQUENT CENTRAL CANAL  STENOSIS OR NEURAL FORAMINAL NARROWING.  C4-5, C5-6, AND C6-7:   THERE ARE MODERATE SIZED POSTERIOR DISK/OSTEOPHYTE COMPLEXES WHICH EFFACE  THE VENTRAL SUBARACHNOID SPACE AND FLATTEN THE VENTRAL ASPECT OF THE CORD.  NO SIGNIFICANT NEURAL  FORAMINAL NARROWING IS IDENTIFIED.  THERE IS NO ABNORMAL CORD SIGNAL.  C7-T1:   NORMAL.   IMPRESSION  1.  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  2.  THERE ARE MODERATE SIZED POSTERIOR DISK/OSTEOPHYTE COMPLEXES AT C4-5, C5-6, AND C6-7 WHICH RESULT IN MILD  CENTRAL CANAL NARROWING; HOWEVER, THERE IS NO ABNORMAL CORD SIGNAL AND NO SIGNIFICANT NEURAL FORAMINAL NARROWING BILATERALLY.  Assessment/Plan:  This is Christie Harris, a 82 y.o. female with: Diabetic neuropathy - stable Right eye vision changes - map dot fingerprint dystrophy per optho; improved with eye drops. Dizziness and imbalance - stable, may be related to B12 deficiency below. History of cervical spine disease. Given ?hyperreflexia, could consider cervical stenosis if worsening Episodes of sweatiness, shakiness, and weakness - mostly in [employer]. Improves with sitting. Sounds like hypotension or hypoglycemia, but unclear. This is also getting less frequent and patient forgets when this last occurred Vitamin B12 deficiency - patient taking B12  Plan: -Continue B12 1000 mcg daily -May consider MRI cervical spine if patient is worsening -Continue eye drops -Fall precautions discussed  Return to clinic in 1 year or sooner if needed  Total time spent reviewing records, interview, history/exam, documentation, and coordination of care on day of encounter:  30 min  Jacquelyne Balint, MD

## 2023-09-10 ENCOUNTER — Encounter: Payer: Self-pay | Admitting: Neurology

## 2023-09-10 ENCOUNTER — Ambulatory Visit: Payer: Medicare PPO | Admitting: Neurology

## 2023-09-10 VITALS — BP 126/84 | HR 77 | Ht 66.5 in | Wt 190.0 lb

## 2023-09-10 DIAGNOSIS — E538 Deficiency of other specified B group vitamins: Secondary | ICD-10-CM | POA: Diagnosis not present

## 2023-09-10 DIAGNOSIS — H547 Unspecified visual loss: Secondary | ICD-10-CM | POA: Diagnosis not present

## 2023-09-10 DIAGNOSIS — I951 Orthostatic hypotension: Secondary | ICD-10-CM | POA: Diagnosis not present

## 2023-09-10 DIAGNOSIS — M542 Cervicalgia: Secondary | ICD-10-CM

## 2023-09-10 DIAGNOSIS — G4733 Obstructive sleep apnea (adult) (pediatric): Secondary | ICD-10-CM | POA: Diagnosis not present

## 2023-09-10 DIAGNOSIS — R2681 Unsteadiness on feet: Secondary | ICD-10-CM

## 2023-09-10 DIAGNOSIS — E1142 Type 2 diabetes mellitus with diabetic polyneuropathy: Secondary | ICD-10-CM

## 2023-09-10 NOTE — Patient Instructions (Signed)
Continue B12 1000 mcg daily.  Continue eye drops.  Please reach out with new or worsening symptoms.  Return to clinic in 1 year or sooner if needed.  The physicians and staff at [facility] are committed to providing excellent care. You may receive a survey requesting feedback about your experience at our office. We strive to receive "very good" responses to the survey questions. If you feel that your experience would prevent you from giving the office a "very good " response, please contact our office to try to remedy the situation. We may be reached at [phone]. Thank you for taking the time out of your busy day to complete the survey.  Jacquelyne Balint, MD Christie Harris  Neurology  Preventing Falls at [facility] are common, often dreaded events in the lives of older people. Aside from the obvious injuries and even death that may result, fall can cause wide-ranging consequences including loss of independence, mental decline, decreased activity and mobility. Younger people are also at risk of falling, especially those with chronic illnesses and fatigue.  Ways to reduce risk for falling Examine diet and medications. Warm foods and alcohol dilate blood vessels, which can lead to dizziness when standing. Sleep aids, antidepressants and pain medications can also increase the likelihood of a fall.  Get a vision exam. Poor vision, cataracts and glaucoma increase the chances of falling.  Check foot gear. Shoes should fit snugly and have a sturdy, nonskid sole and a broad, low heel  Participate in a physician-approved exercise program to build and maintain muscle strength and improve balance and coordination. Programs that use ankle weights or stretch bands are excellent for muscle-strengthening. Water aerobics programs and low-impact Tai Chi programs have also been shown to improve balance and coordination.  Increase vitamin D intake. Vitamin D improves muscle strength and increases the amount of  calcium the body is able to absorb and deposit in bones.  How to prevent falls from common hazards Floors - Remove all loose wires, cords, and throw rugs. Minimize clutter. Make sure rugs are anchored and smooth. Keep furniture in its usual place.  Chairs -- Use chairs with straight backs, armrests and firm seats. Add firm cushions to existing pieces to add height.  Bathroom - Install grab bars and non-skid tape in the tub or shower. Use a bathtub transfer bench or a shower chair with a back support Use an elevated toilet seat and/or safety rails to assist standing from a low surface. Do not use towel racks or bathroom tissue holders to help you stand.  Lighting - Make sure halls, stairways, and entrances are well-lit. Install a night light in your bathroom or hallway. Make sure there is a light switch at the top and bottom of the staircase. Turn lights on if you get up in the middle of the night. Make sure lamps or light switches are within reach of the bed if you have to get up during the night.  Kitchen - Install non-skid rubber mats near the sink and stove. Clean spills immediately. Store frequently used utensils, pots, pans between waist and eye level. This helps prevent reaching and bending. Sit when getting things out of lower cupboards.  Living room/ Bedrooms - Place furniture with wide spaces in between, giving enough room to move around. Establish a route through the living room that gives you something to hold onto as you walk.  Stairs - Make sure treads, rails, and rugs are secure. Install a rail on both sides of the  stairs. If stairs are a threat, it might be helpful to arrange most of your activities on the lower level to reduce the number of times you must climb the stairs.  Entrances and doorways - Install metal handles on the walls adjacent to the doorknobs of all doors to make it more secure as you travel through the doorway.  Tips for maintaining balance Keep at least one hand  free at all times. Try using a backpack or fanny pack to hold things rather than carrying them in your hands. Never carry objects in both hands when walking as this interferes with keeping your balance.  Attempt to swing both arms from front to back while walking. This might require a conscious effort if Parkinson's disease has diminished your movement. It will, however, help you to maintain balance and posture, and reduce fatigue.  Consciously lift your feet off of the ground when walking. Shuffling and dragging of the feet is a common culprit in losing your balance.  When trying to navigate turns, use a "U" technique of facing forward and making a wide turn, rather than pivoting sharply.  Try to stand with your feet shoulder-length apart. When your feet are close together for any length of time, you increase your risk of losing your balance and falling.  Do one thing at a time. Don't try to walk and accomplish another task, such as reading or looking around. The decrease in your automatic reflexes complicates motor function, so the less distraction, the better.  Do not wear rubber or gripping soled shoes, they might "catch" on the floor and cause tripping.  Move slowly when changing positions. Use deliberate, concentrated movements and, if needed, use a grab bar or walking aid. Count 15 seconds between each movement. For example, when rising from a seated position, wait 15 seconds after standing to begin walking.  If balance is a continuous problem, you might want to consider a walking aid such as a cane, walking stick, or walker. Once you've mastered walking with help, you might be ready to try it on your own again.

## 2023-09-16 DIAGNOSIS — G4733 Obstructive sleep apnea (adult) (pediatric): Secondary | ICD-10-CM | POA: Diagnosis not present

## 2023-10-17 DIAGNOSIS — G4733 Obstructive sleep apnea (adult) (pediatric): Secondary | ICD-10-CM | POA: Diagnosis not present

## 2023-11-17 DIAGNOSIS — G4733 Obstructive sleep apnea (adult) (pediatric): Secondary | ICD-10-CM | POA: Diagnosis not present

## 2023-11-19 DIAGNOSIS — Z85828 Personal history of other malignant neoplasm of skin: Secondary | ICD-10-CM | POA: Diagnosis not present

## 2023-11-19 DIAGNOSIS — L720 Epidermal cyst: Secondary | ICD-10-CM | POA: Diagnosis not present

## 2023-12-15 DIAGNOSIS — G4733 Obstructive sleep apnea (adult) (pediatric): Secondary | ICD-10-CM | POA: Diagnosis not present

## 2023-12-23 DIAGNOSIS — G4733 Obstructive sleep apnea (adult) (pediatric): Secondary | ICD-10-CM | POA: Diagnosis not present

## 2024-01-07 DIAGNOSIS — M1712 Unilateral primary osteoarthritis, left knee: Secondary | ICD-10-CM | POA: Diagnosis not present

## 2024-01-15 DIAGNOSIS — G4733 Obstructive sleep apnea (adult) (pediatric): Secondary | ICD-10-CM | POA: Diagnosis not present

## 2024-02-09 DIAGNOSIS — F419 Anxiety disorder, unspecified: Secondary | ICD-10-CM | POA: Diagnosis not present

## 2024-02-09 DIAGNOSIS — F3341 Major depressive disorder, recurrent, in partial remission: Secondary | ICD-10-CM | POA: Diagnosis not present

## 2024-02-09 DIAGNOSIS — G4733 Obstructive sleep apnea (adult) (pediatric): Secondary | ICD-10-CM | POA: Diagnosis not present

## 2024-02-09 DIAGNOSIS — H9190 Unspecified hearing loss, unspecified ear: Secondary | ICD-10-CM | POA: Diagnosis not present

## 2024-02-09 DIAGNOSIS — Z23 Encounter for immunization: Secondary | ICD-10-CM | POA: Diagnosis not present

## 2024-02-09 DIAGNOSIS — M810 Age-related osteoporosis without current pathological fracture: Secondary | ICD-10-CM | POA: Diagnosis not present

## 2024-02-09 DIAGNOSIS — E1169 Type 2 diabetes mellitus with other specified complication: Secondary | ICD-10-CM | POA: Diagnosis not present

## 2024-02-09 DIAGNOSIS — E7800 Pure hypercholesterolemia, unspecified: Secondary | ICD-10-CM | POA: Diagnosis not present

## 2024-02-09 DIAGNOSIS — R21 Rash and other nonspecific skin eruption: Secondary | ICD-10-CM | POA: Diagnosis not present

## 2024-02-09 DIAGNOSIS — R350 Frequency of micturition: Secondary | ICD-10-CM | POA: Diagnosis not present

## 2024-02-09 DIAGNOSIS — R600 Localized edema: Secondary | ICD-10-CM | POA: Diagnosis not present

## 2024-02-14 DIAGNOSIS — G4733 Obstructive sleep apnea (adult) (pediatric): Secondary | ICD-10-CM | POA: Diagnosis not present

## 2024-02-24 ENCOUNTER — Encounter: Payer: Self-pay | Admitting: Podiatry

## 2024-02-24 ENCOUNTER — Telehealth: Payer: Self-pay | Admitting: Podiatry

## 2024-02-24 ENCOUNTER — Ambulatory Visit: Admitting: Podiatry

## 2024-02-24 DIAGNOSIS — M79676 Pain in unspecified toe(s): Secondary | ICD-10-CM | POA: Diagnosis not present

## 2024-02-24 DIAGNOSIS — R413 Other amnesia: Secondary | ICD-10-CM | POA: Insufficient documentation

## 2024-02-24 DIAGNOSIS — K5730 Diverticulosis of large intestine without perforation or abscess without bleeding: Secondary | ICD-10-CM | POA: Insufficient documentation

## 2024-02-24 DIAGNOSIS — B351 Tinea unguium: Secondary | ICD-10-CM | POA: Diagnosis not present

## 2024-02-24 DIAGNOSIS — F32A Depression, unspecified: Secondary | ICD-10-CM | POA: Insufficient documentation

## 2024-02-24 DIAGNOSIS — E1142 Type 2 diabetes mellitus with diabetic polyneuropathy: Secondary | ICD-10-CM | POA: Diagnosis not present

## 2024-02-24 DIAGNOSIS — E782 Mixed hyperlipidemia: Secondary | ICD-10-CM | POA: Insufficient documentation

## 2024-02-24 DIAGNOSIS — F3341 Major depressive disorder, recurrent, in partial remission: Secondary | ICD-10-CM | POA: Insufficient documentation

## 2024-02-24 DIAGNOSIS — E7800 Pure hypercholesterolemia, unspecified: Secondary | ICD-10-CM | POA: Insufficient documentation

## 2024-02-24 DIAGNOSIS — M1990 Unspecified osteoarthritis, unspecified site: Secondary | ICD-10-CM | POA: Insufficient documentation

## 2024-02-24 DIAGNOSIS — F419 Anxiety disorder, unspecified: Secondary | ICD-10-CM | POA: Insufficient documentation

## 2024-02-24 DIAGNOSIS — F411 Generalized anxiety disorder: Secondary | ICD-10-CM | POA: Insufficient documentation

## 2024-02-24 DIAGNOSIS — Z860100 Personal history of colon polyps, unspecified: Secondary | ICD-10-CM | POA: Insufficient documentation

## 2024-02-24 DIAGNOSIS — H9190 Unspecified hearing loss, unspecified ear: Secondary | ICD-10-CM | POA: Insufficient documentation

## 2024-02-24 DIAGNOSIS — E119 Type 2 diabetes mellitus without complications: Secondary | ICD-10-CM | POA: Insufficient documentation

## 2024-02-24 DIAGNOSIS — M545 Low back pain, unspecified: Secondary | ICD-10-CM | POA: Insufficient documentation

## 2024-02-24 DIAGNOSIS — G629 Polyneuropathy, unspecified: Secondary | ICD-10-CM | POA: Insufficient documentation

## 2024-02-24 DIAGNOSIS — F324 Major depressive disorder, single episode, in partial remission: Secondary | ICD-10-CM | POA: Insufficient documentation

## 2024-02-24 DIAGNOSIS — M810 Age-related osteoporosis without current pathological fracture: Secondary | ICD-10-CM | POA: Insufficient documentation

## 2024-02-25 NOTE — Progress Notes (Signed)
 Subjective:  Patient ID: Christie Harris, female    DOB: Aug 29, 1941,  MRN: [id number] HPI Chief Complaint  Patient presents with   Diabetic foot care    Rm7/ Diabetic foot care/ NIDDM/A1C 7.5    83 y.o. female presents with the above complaint.   ROS: Denies fever chills nausea vomit muscle aches pains calf pain back pain chest pain shortness of breath  Past Medical History:  Diagnosis Date   Anxiety    Arthritis    greater left hip   CA of skin    squamous cell right leg-recent excision   Depression    Depression    GERD (gastroesophageal reflux disease)    Hypercholesteremia    Obesity    Osteoarthrosis    Osteoporosis    Shortness of breath    on exertion   Sleep apnea    CPAP at 13cm H2o   Sleep apnea    Past Surgical History:  Procedure Laterality Date   ABDOMINAL HYSTERECTOMY     uterus and ovaries   CATARACT EXTRACTION Right    CHOLECYSTECTOMY     COLONOSCOPY WITH PROPOFOL  N/A 03/23/2013   Procedure: COLONOSCOPY WITH PROPOFOL ;  Surgeon: Garrett Kallman, MD;  Location: [facility];  Service: Endoscopy;  Laterality: N/A;colon polyps removed-benign   ESOPHAGOGASTRODUODENOSCOPY (EGD) WITH PROPOFOL  N/A 06/16/2014   Procedure: ESOPHAGOGASTRODUODENOSCOPY (EGD) WITH PROPOFOL ;  Surgeon: Garrett Kallman, MD;  Location: [facility];  Service: Endoscopy;  Laterality: N/A;   plastic surgery on nose     "deviated septum and cosmetic"    Current Outpatient Medications:    busPIRone (BUSPAR) 15 MG tablet, Take 15 mg by mouth 2 (two) times daily., Disp: , Rfl:    calcium carbonate (OS-CAL) 600 MG TABS, Take 600 mg by mouth daily., Disp: , Rfl:    citalopram (CELEXA) 20 MG tablet, Take 20 mg by mouth every evening., Disp: , Rfl:    clotrimazole-betamethasone (LOTRISONE) cream, 1 Application., Disp: , Rfl:    cyanocobalamin (VITAMIN B12) 1000 MCG tablet, Take 1,000 mcg by mouth daily., Disp: , Rfl:    meloxicam (MOBIC) 15 MG tablet, Take 15 mg by mouth daily., Disp: ,  Rfl:    metFORMIN (GLUCOPHAGE) 500 MG tablet, 1 tablet with a meal Orally twice a day for 30 days, Disp: , Rfl:    mupirocin ointment (BACTROBAN) 2 %, SMARTSIG:1 Application Topical 2-3 Times Daily, Disp: , Rfl:    omeprazole (PRILOSEC) 20 MG capsule, Take 20 mg by mouth every evening., Disp: , Rfl:    rosuvastatin (CRESTOR) 5 MG tablet, Take 5 mg by mouth every evening., Disp: , Rfl:    XANAX 0.25 MG tablet, 1 tablet Orally once a day for 30 days As needed, Disp: , Rfl:   Allergies  Allergen Reactions   Darvocet [Propoxyphene N-Acetaminophen] Other (See Comments)    "out of my mind"   Scallops [Shellfish Allergy] Nausea And Vomiting   Review of Systems Objective:  There were no vitals filed for this visit.  General: Well developed, nourished, in no acute distress, alert and oriented x3   Dermatological: Skin is warm, dry and supple bilateral. Nails x 10 are thick yellow dystrophic clinically mycotic and painful remaining integument appears unremarkable at this time. There are no open sores, no preulcerative lesions, no rash or signs of infection present.  Vascular: Dorsalis Pedis artery and Posterior Tibial artery pedal pulses are 2/4 bilateral with immedate capillary fill time. Pedal hair growth present. No varicosities and no lower extremity  edema present bilateral.   Neruologic: Grossly intact via light touch bilateral. Vibratory intact via tuning fork bilateral. Protective threshold with Semmes Wienstein monofilament diminished to all pedal sites bilateral. Patellar and Achilles deep tendon reflexes 2+ bilateral. No Babinski or clonus noted bilateral.   Musculoskeletal: No gross boney pedal deformities bilateral. No pain, crepitus, or limitation noted with foot and ankle range of motion bilateral. Muscular strength 5/5 in all groups tested bilateral.  Gait: Unassisted, Nonantalgic.    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

## 2024-05-10 NOTE — Telephone Encounter (Signed)
 error

## 2024-05-19 DIAGNOSIS — M5430 Sciatica, unspecified side: Secondary | ICD-10-CM | POA: Diagnosis not present

## 2024-05-19 DIAGNOSIS — M545 Low back pain, unspecified: Secondary | ICD-10-CM | POA: Diagnosis not present

## 2024-05-26 DIAGNOSIS — L821 Other seborrheic keratosis: Secondary | ICD-10-CM | POA: Diagnosis not present

## 2024-05-26 DIAGNOSIS — D2371 Other benign neoplasm of skin of right lower limb, including hip: Secondary | ICD-10-CM | POA: Diagnosis not present

## 2024-05-26 DIAGNOSIS — Z85828 Personal history of other malignant neoplasm of skin: Secondary | ICD-10-CM | POA: Diagnosis not present

## 2024-05-26 DIAGNOSIS — D225 Melanocytic nevi of trunk: Secondary | ICD-10-CM | POA: Diagnosis not present

## 2024-05-26 DIAGNOSIS — L720 Epidermal cyst: Secondary | ICD-10-CM | POA: Diagnosis not present

## 2024-05-26 DIAGNOSIS — D2362 Other benign neoplasm of skin of left upper limb, including shoulder: Secondary | ICD-10-CM | POA: Diagnosis not present

## 2024-05-26 DIAGNOSIS — L84 Corns and callosities: Secondary | ICD-10-CM | POA: Diagnosis not present

## 2024-06-09 ENCOUNTER — Ambulatory Visit: Admitting: Podiatry

## 2024-06-09 ENCOUNTER — Encounter: Payer: Self-pay | Admitting: Podiatry

## 2024-06-09 DIAGNOSIS — B351 Tinea unguium: Secondary | ICD-10-CM | POA: Diagnosis not present

## 2024-06-09 DIAGNOSIS — M79676 Pain in unspecified toe(s): Secondary | ICD-10-CM

## 2024-06-09 DIAGNOSIS — S91302A Unspecified open wound, left foot, initial encounter: Secondary | ICD-10-CM | POA: Diagnosis not present

## 2024-06-09 DIAGNOSIS — E119 Type 2 diabetes mellitus without complications: Secondary | ICD-10-CM

## 2024-06-09 DIAGNOSIS — E1142 Type 2 diabetes mellitus with diabetic polyneuropathy: Secondary | ICD-10-CM

## 2024-06-09 MED ORDER — GENTAMICIN SULFATE 0.1 % EX CREA: TOPICAL_CREAM | CUTANEOUS | 1 refills | Status: AC

## 2024-06-09 NOTE — Patient Instructions (Signed)
   DRESSING CHANGES LEFT FOOT:   PHARMACY SHOPPING LIST: DIAL ANTIBACTERIAL SOAP OR Wound Cleanser for cleaning wound 2 x 2 inch sterile gauze for cleaning wound GENTAMICIN  CREAM  KEEP LEFT FOOT DRY AT ALL TIMES!!!!  CLEANSE LEFT FOOT WITH SALINE OR WOUND CLEANSER.  DAB DRY WITH GAUZE SPONGE.  APPLY A LIGHT AMOUNT OF GENTAMICIN  CREAM TO LEFT FOOT WOUND.  APPLY FABRIC BAND-AID.  DO NOT WALK BAREFOOT!!!  IF YOU EXPERIENCE ANY FEVER, CHILLS, NIGHTSWEATS, NAUSEA OR VOMITING, ELEVATED OR LOW BLOOD SUGARS, REPORT TO EMERGENCY ROOM.  IF YOU EXPERIENCE INCREASED REDNESS, PAIN, SWELLING, DISCOLORATION, ODOR, PUS, DRAINAGE OR WARMTH OF YOUR FOOT, REPORT TO EMERGENCY ROOM.

## 2024-06-11 ENCOUNTER — Telehealth: Payer: Self-pay | Admitting: Podiatry

## 2024-06-11 NOTE — Telephone Encounter (Deleted)
 Non of the local [facility] are able to get: gentamicin  cream (GARAMYCIN ) 0.1 % . They are hoping they come in this evening. However in the event that it does not , can you recommend and send in another alterative that will next be best.

## 2024-06-11 NOTE — Telephone Encounter (Signed)
 None of the local [facility] pharmacies currently have Gentamicin  cream (Garamycin ) 0.1% in stock. They are hopeful it will arrive this evening.  However, in the event that it does not, could you please recommend and send in an alternative that would be the next best option?  Thank you!

## 2024-06-11 NOTE — Telephone Encounter (Deleted)
 Non of the local [facility] are able to get: gentamicin  cream (GARAMYCIN ) 0.1 % . They are hoping they come in this evening. However in the event that it does not , can you recommend and send in another alterative that will next be best.

## 2024-06-13 NOTE — Progress Notes (Signed)
 Subjective:  Patient ID: Christie Harris, female    DOB: [birth year],  MRN: [id number]  Christie Harris presents to clinic today for at risk foot care with history of diabetic neuropathy and painful thick toenails that are difficult to trim. Pain interferes with ambulation. Aggravating factors include wearing enclosed shoe gear. Pain is relieved with periodic professional debridement.   Patient states she was told she had a corn on the plantar aspect of her left foot. She was instructed to purchase corn remover. Patient states there was a flap of skin she attempted to remove which resulted in bleeding from area.  Chief Complaint  Patient presents with   RFC    Diabetic A1c 7.5  PCP Dr Tanda Polite LV 02/09/24. Dermatologist let her know she had a Corn on L 1st meta plantar.    PCP is Rexanne Tanda, MD.  Allergies  Allergen Reactions   Darvocet [Propoxyphene N-Acetaminophen] Other (See Comments)    out of my mind   Scallops [Shellfish Allergy] Nausea And Vomiting    Review of Systems: Negative except as noted in the HPI.  Objective: No changes noted in today's physical examination. There were no vitals filed for this visit. Christie Harris is a pleasant 83 y.o. female WD, WN in NAD. AAO x 3.  Vascular Examination: Capillary refill time immediate b/l. Palpable pedal pulses. Pedal hair present b/l. Pedal edema absent b/l. No pain with calf compression b/l. Skin temperature gradient WNL b/l. No cyanosis or clubbing b/l. No ischemia or gangrene noted b/l.   Neurological Examination: Protective sensation diminished with 10g monofilament b/l.  Dermatological Examination: Pedal skin with normal turgor, texture and tone b/l.  No open wounds. No interdigital macerations.   Toenails 1-5 b/l thick, discolored, elongated with subungual debris and pain on dorsal palpation. .  Flap of skin noted submet head 1 left foot with . Hyperkeratotic lesion(s) submet head 1 left foot.  No erythema,  no edema, no drainage, no fluctuance.   Musculoskeletal Examination: Muscle strength 5/5 to all lower extremity muscle groups bilaterally. HAV with bunion deformity noted b/l LE.  Radiographs: None  Assessment/Plan: 1. Pain due to onychomycosis of toenail   2. Wound of left foot   3. Diabetic polyneuropathy associated with type 2 diabetes mellitus (HCC)   4. Encounter for diabetic foot exam (HCC)     Meds ordered this encounter  Medications   gentamicin  cream (GARAMYCIN ) 0.1 %    Sig: Apply to left foot once daily.    Dispense:  30 g    Refill:  1  -Patient was evaluated today. All questions/concerns addressed on today's visit. -Patient educated on avoiding OTC chemical corn/callus removers due to her h/o diabetes. She related understanding. -Skin flap submet head 1 left foot debrided. Area cleansed with Wound Cleanser. TAO and band-aid applied. -Patient to continue soft, supportive shoe gear daily. -Toenails 1-5 b/l were debrided in length and girth with sterile nail nippers and dremel without iatrogenic bleeding.  -Prescription written for Gentamicin  Cream. Patient is to apply to left foot once daily and cover with dressing.  -Written instructions provided for patient for daily local wound care. Follow up with Dr. Awanda Imperial in one week for left foot wound. -Follow up in 3 months with me for diabetic foot care. -Patient/POA to call should there be question/concern in the interim.   Return in about 3 months (around 09/08/2024).  Christie Harris, DPM      [facility] LOCATION: [address]  [place], [place] [address]                   Office [phone]   [facility] LOCATION: [address] [place], [place] [address] Office [phone]

## 2024-06-14 NOTE — Telephone Encounter (Signed)
 Left detailed message.

## 2024-06-14 NOTE — Telephone Encounter (Signed)
 Thank you for the update!

## 2024-06-21 ENCOUNTER — Ambulatory Visit: Admitting: Podiatry

## 2024-06-29 ENCOUNTER — Ambulatory Visit: Admitting: Podiatry

## 2024-06-29 DIAGNOSIS — S91302D Unspecified open wound, left foot, subsequent encounter: Secondary | ICD-10-CM | POA: Diagnosis not present

## 2024-06-29 NOTE — Progress Notes (Unsigned)
     Chief Complaint  Patient presents with   Wound Check    F/U one week for left foot wound. NIDDM A1C ? 2 pain.    HPI: 83 y.o. female presents today for follow-up of a skin tear to the plantar aspect of the left foot, submet 1.  She has been applying antibiotic ointment to the area.  Dr. Gaynel had placed an offloading pad in her shoes last visit but she is not wearing those shoes today.  Past Medical History:  Diagnosis Date   Anxiety    Arthritis    greater left hip   CA of skin    squamous cell right leg-recent excision   Depression    Depression    GERD (gastroesophageal reflux disease)    Hypercholesteremia    Obesity    Osteoarthrosis    Osteoporosis    Shortness of breath    on exertion   Sleep apnea    CPAP at 13cm H2o   Sleep apnea    Past Surgical History:  Procedure Laterality Date   ABDOMINAL HYSTERECTOMY     uterus and ovaries   CATARACT EXTRACTION Right    CHOLECYSTECTOMY     COLONOSCOPY WITH PROPOFOL  N/A 03/23/2013   Procedure: COLONOSCOPY WITH PROPOFOL ;  Surgeon: Gladis MARLA Louder, MD;  Location: [facility];  Service: Endoscopy;  Laterality: N/A;colon polyps removed-benign   ESOPHAGOGASTRODUODENOSCOPY (EGD) WITH PROPOFOL  N/A 06/16/2014   Procedure: ESOPHAGOGASTRODUODENOSCOPY (EGD) WITH PROPOFOL ;  Surgeon: Gladis MARLA Louder, MD;  Location: [facility];  Service: Endoscopy;  Laterality: N/A;   plastic surgery on nose     deviated septum and cosmetic   Allergies  Allergen Reactions   Darvocet [Propoxyphene N-Acetaminophen] Other (See Comments)    out of my mind   Scallops [Shellfish Allergy] Nausea And Vomiting    Physical Exam: Trace palpable pedal pulses.  The skin tear/wound is now closed left submet 1.  No surrounding erythema no drainage is noted.  There is a small focal hyperkeratotic lesion remaining.  There is some pain on palpation of this lesion.  Assessment/Plan of Care: 1. Open wound of left foot, subsequent encounter     The area  was debrided with a sterile #313 blade to remove any hyperkeratotic tissue that was remaining.  No further treatment is needed at this time.  She will follow-up with Dr. Gaynel as scheduled for her at risk footcare.  Christie Harris, DPM, FACFAS [facility]     [address] [place], [place] [address]                Office [phone]  Fax [phone]

## 2024-08-17 DIAGNOSIS — E7800 Pure hypercholesterolemia, unspecified: Secondary | ICD-10-CM | POA: Diagnosis not present

## 2024-08-17 DIAGNOSIS — M810 Age-related osteoporosis without current pathological fracture: Secondary | ICD-10-CM | POA: Diagnosis not present

## 2024-08-17 DIAGNOSIS — Z0000 Encounter for general adult medical examination without abnormal findings: Secondary | ICD-10-CM | POA: Diagnosis not present

## 2024-08-17 DIAGNOSIS — R197 Diarrhea, unspecified: Secondary | ICD-10-CM | POA: Diagnosis not present

## 2024-08-17 DIAGNOSIS — E1169 Type 2 diabetes mellitus with other specified complication: Secondary | ICD-10-CM | POA: Diagnosis not present

## 2024-08-17 DIAGNOSIS — F419 Anxiety disorder, unspecified: Secondary | ICD-10-CM | POA: Diagnosis not present

## 2024-08-17 DIAGNOSIS — F3341 Major depressive disorder, recurrent, in partial remission: Secondary | ICD-10-CM | POA: Diagnosis not present

## 2024-08-17 DIAGNOSIS — Z1331 Encounter for screening for depression: Secondary | ICD-10-CM | POA: Diagnosis not present

## 2024-08-17 DIAGNOSIS — H9190 Unspecified hearing loss, unspecified ear: Secondary | ICD-10-CM | POA: Diagnosis not present

## 2024-08-17 DIAGNOSIS — Z5181 Encounter for therapeutic drug level monitoring: Secondary | ICD-10-CM | POA: Diagnosis not present

## 2024-08-17 DIAGNOSIS — R350 Frequency of micturition: Secondary | ICD-10-CM | POA: Diagnosis not present

## 2024-08-17 DIAGNOSIS — G4733 Obstructive sleep apnea (adult) (pediatric): Secondary | ICD-10-CM | POA: Diagnosis not present

## 2024-09-01 NOTE — Progress Notes (Signed)
 NEUROLOGY FOLLOW UP OFFICE NOTE  Christie Harris [id number]  Subjective:  Christie Harris is a 83 y.o. year old right-handed female with a medical history of DM2 c/b neuropathy, HLD, anxiety, depression, OSA (on CPAP), OA, hearing loss, GERD who we last saw on 09/10/23 for right eye vision changes, dizziness and imbalance, episodes of sweatiness, shakiness, and weakness, B12 deficiency, fatigue.  To briefly review: Initial consult 06/12/22: Gait imbalance with falls x2 this weekend -This has been going on for about 1 year. -She describes walking, then staggering. She mentions that she walks funny. She feels like her feet are close to the ground and shuffling. She denies feeling stuck.  -She describes dizzy vs lightheaded. She feels like her head is not attached to her body. She does not think her body can do what she is telling it to do. She even finds it difficult to turn in bed. -For the first 2 hours every morning her hands shake and she is more likely to have imbalance than later in the day. She thinks the shaking is when she is moving and when staying still. She mentions reaching for her glasses in the morning and her hand shaking very badly when reaching for them. -She states that it is hard to get in cars and that she tends to walk behind everyone else (can't keep up). -She sleeps alone, but does not think she is acting out her dreams or having nightmares. She wakes up feeling rested.   Dizzy, hot, shaky, weak feeling episodes. -She describes acute attacks that come on suddenly -This has been going on around 10 years. She was standing at the time. It lasted a few minutes then resolved. It was very infrequent at that time (maybe yearly). -The episodes became more frequent over the last 2 years. Patient goes to the gym am then goes to [employer] directly afterwards. She eats before going to the gym, but does not drink water well. She thinks she tends to be dehydrated per her account.  She tends to have episodes while grocery shopping. -She can have episodes at home at well. She had an episode she remembers when she was picking up dishes. -The episodes still last a few minutes, but now occur about once per week.  -She does not know what helps, but it resolves on its own. She isn't sure that eating something helps or if sitting helps.    Pain in back of head -Patient has had this for about a decade. She describes it as a throbbing pain that is located on the back of her head and neck. It may be a little more on the right side. She has the pain daily. Leaning or laying on something makes it worse. Even a pillow causes discomfort. Her CPAP runs behind her head but she does not think things are better if she does not have it on her. She endorses that the CPAP strap is tight. She also mentions she needs new equipment. -She rates the pain as 7/10. It happens more at night but does not seem to be worse with position. -She previously followed with Dr Wilbert Bihari for her CPAP (last seen in 2014). She does not think she has anyone following this currently.   Vision loss -Patient had cataract surgery on her left eye at the beginning of the COVID pandemic. She has felt her vision has been poor since. -1 year ago at her eye exam she was told there was no problems with  her eyes. -She has an appointment next week for her annual check up. She thinks she sees an optometrist. -She endorses double vision with one line of text angling off. Her vision is very blurry. It is constant and does not fluctuate, but there may be worse symptoms at night. -Closing one eye does not help.   Patient was seen by her PCP, Dr. Rexanne on 03/14/22. She mentioned dizziness with change of position. Orthostatic vitals were negative. The dizziness then became more random and not associated with position change. She was off balance when walking. She also mentioned vision changes and trouble reading. Per notes, patient saw  an ophthalmologist who found no problems with her eyes. Patient was referred to neurology for further evaluation.   09/18/22: Patient's B12 was borderline low, so I recommended supplementation with B12 1000 mcg daily. Patient has been taking the B12 supplement. Labs were otherwise normal.   Patient had a recent fall when getting up to use to restroom at night. She hurt her right foot (broke 3 bones).   She went to PT. This improved her neck range of motion and neck pain modestly.    Patient had her vision checked and only found to have dry eyes. She sees improvement when she uses her eye drops.  Patient did her sleep study last week. She got the report yesterday. She had mild OSA. She is continuing her CPAP.   03/21/23: Recheck of B12 was normal (515).   Her neck pain is greatly improved. It is much better than prior. She now has exercises as needed.   Patient has not had any falls since last visit. She thinks her dizziness has improved. She has less frequent episodes of shakiness, weakness, and sweaty. It still occurs, but less often than once per week which it was at our initial consult. It is only occurring when she goes to [employer]. She goes to sit down and it will improve (usually in 10 minutes, but once 30 minutes). The last one was 3 weeks.   She is having trouble with her right eye. She is having darker vision in her right eye compared to left. She has not spoke to her eye doctor about this.   She is also having left knee pain. She is seeing ortho next week for this.    09/10/23: Vit D was normal. She is still taking B12. She has some dizziness and imbalance, but she thinks this is about the same as prior. She has had 2 minor half falls since last visit, like tripping over a chair.   Patient still feels like she sees poorly. 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 She was given eye drops that helped a lot. She thinks this has improved her vision.    She still gets episodes of sweatiness and shakiness and weakness, usually in [employer], about once per month. She will sit down and improve. She has not had one in awhile.   She has a new CPAP mask that she likes much more. She still does not sleep well though because she wakes up frequently to go to the bathroom.   She has no new complaints.  Most recent Assessment and Plan (09/10/23): This is Christie Harris, a 83 y.o. female with: Diabetic neuropathy - stable Right eye vision changes - map dot fingerprint dystrophy per optho; improved with eye drops. Dizziness and imbalance - stable, may be related to B12 deficiency below. History of cervical spine disease. Given ?  hyperreflexia, could consider cervical stenosis if worsening Episodes of sweatiness, shakiness, and weakness - mostly in [employer]. Improves with sitting. Sounds like hypotension or hypoglycemia, but unclear. This is also getting less frequent and patient forgets when this last occurred Vitamin B12 deficiency - patient taking B12   Plan: -Continue B12 1000 mcg daily -May consider MRI cervical spine if patient is worsening -Continue eye drops -Fall precautions discussed  Since their last visit: She had one 1 fall while visiting her granddaughter in [place]. She was getting out of the shower and fell over trying to put on her pants. She hit her head hard. She was taken to the hospital. She had xrays and everything checked out. She declined to be admitted for observation. She had 3 falls since, all minor. She is walking with a cane. She has a rollator but has not used it yet.  Her dizziness is about the same. She still has poor vision. She is still taking the eye drops.  She still takes B12 daily.  MEDICATIONS:  Outpatient Encounter Medications as of 09/09/2024  Medication Sig   busPIRone (BUSPAR) 15 MG tablet Take 15 mg by mouth 2 (two) times daily.   calcium carbonate (OS-CAL) 600 MG TABS Take 600 mg by mouth  daily.   citalopram (CELEXA) 20 MG tablet Take 20 mg by mouth every evening.   clotrimazole-betamethasone (LOTRISONE) cream 1 Application.   cyanocobalamin (VITAMIN B12) 1000 MCG tablet Take 1,000 mcg by mouth daily.   gentamicin  cream (GARAMYCIN ) 0.1 % Apply to left foot once daily.   meloxicam (MOBIC) 15 MG tablet Take 15 mg by mouth daily.   metFORMIN (GLUCOPHAGE) 500 MG tablet 1 tablet with a meal Orally twice a day for 30 days   mupirocin ointment (BACTROBAN) 2 % SMARTSIG:1 Application Topical 2-3 Times Daily   MYRBETRIQ 50 MG TB24 tablet Take 50 mg by mouth daily.   omeprazole (PRILOSEC) 20 MG capsule Take 20 mg by mouth every evening.   rosuvastatin (CRESTOR) 5 MG tablet Take 5 mg by mouth every evening.   XANAX 0.25 MG tablet 1 tablet Orally once a day for 30 days As needed   No facility-administered encounter medications on file as of 09/09/2024.    PAST MEDICAL HISTORY: Past Medical History:  Diagnosis Date   Anxiety    Arthritis    greater left hip   CA of skin    squamous cell right leg-recent excision   Depression    Depression    GERD (gastroesophageal reflux disease)    Hypercholesteremia    Obesity    Osteoarthrosis    Osteoporosis    Shortness of breath    on exertion   Sleep apnea    CPAP at 13cm H2o   Sleep apnea     PAST SURGICAL HISTORY: Past Surgical History:  Procedure Laterality Date   ABDOMINAL HYSTERECTOMY     uterus and ovaries   CATARACT EXTRACTION Right    CHOLECYSTECTOMY     COLONOSCOPY WITH PROPOFOL  N/A 03/23/2013   Procedure: COLONOSCOPY WITH PROPOFOL ;  Surgeon: Gladis MARLA Louder, MD;  Location: [facility];  Service: Endoscopy;  Laterality: N/A;colon polyps removed-benign   ESOPHAGOGASTRODUODENOSCOPY (EGD) WITH PROPOFOL  N/A 06/16/2014   Procedure: ESOPHAGOGASTRODUODENOSCOPY (EGD) WITH PROPOFOL ;  Surgeon: Gladis MARLA Louder, MD;  Location: [facility];  Service: Endoscopy;  Laterality: N/A;   plastic surgery on nose     deviated septum  and cosmetic    ALLERGIES: Allergies  Allergen Reactions   Darvocet [Propoxyphene  N-Acetaminophen] Other (See Comments)    out of my mind   Scallops [Shellfish Allergy] Nausea And Vomiting    FAMILY HISTORY: Family History  Problem Relation Age of Onset   Breast cancer Mother    Diabetes Father    Congestive Heart Failure Father    Hypertension Father    Lung cancer Sister    Heart attack Brother    Kidney disease Brother    Hypertension Brother    Kidney disease Brother     SOCIAL HISTORY: Social History   Tobacco Use   Smoking status: Never    Passive exposure: Past   Smokeless tobacco: Never  Vaping Use   Vaping status: Never Used  Substance Use Topics   Alcohol use: Yes    Comment: occ   Drug use: No   Social History   Social History Narrative   Right handed   Caffeine 1-2 mug in am 2 coke in pm    Live in a two story home with daughter      Objective:  Vital Signs:  BP (!) 147/86   Pulse 88   Ht 5' 5 (1.651 m)   Wt 185 lb 6.4 oz (84.1 kg)   SpO2 98%   BMI 30.85 kg/m   General: No acute distress.  Patient appears well-groomed.   Head:  Normocephalic/atraumatic Neck: supple Heart: regular rate and rhythm Lungs: Clear to auscultation bilaterally. Vascular: No carotid bruits.  Neurological Exam: Mental status: alert and oriented, speech fluent and not dysarthric, language intact.  Cranial nerves: CN I: not tested CN II: pupils equal, round and reactive to light, visual fields intact CN III, IV, VI:  full range of motion, no nystagmus, no ptosis CN V: facial sensation intact. CN VII: upper and lower face symmetric CN VIII: hearing intact CN IX, X: uvula midline CN XI: sternocleidomastoid and trapezius muscles intact CN XII: tongue midline  Bulk & Tone: normal, no fasciculations. Motor:  muscle strength 5/5 throughout Deep Tendon Reflexes:  3+ throughout Sensation:  Pinprick sensation intact. Finger to nose testing:  Without  dysmetria.   Gait:  Normal station and stride.   Labs and Imaging review: No new results  Previously reviewed results: Vit D (03/21/23) wnl   B12 (09/18/22): 515   06/12/22: TSH: 1.26 B12: 288 IFE and SPEP unremarkable MG panel (AChR binding abs, striated muscle abs) negative   External labs: Normal or unremarkable: CMP HbA1c (12/20/21): 6.7   CT head wo contrast (04/29/22): FINDINGS: Brain: No intracranial hemorrhage, mass effect, or evidence of acute infarct. No hydrocephalus. No extra-axial fluid collection. Mild cerebral volume loss. Ill-defined hypoattenuation within the cerebral white matter is nonspecific but consistent with chronic small vessel ischemic disease.   Vascular: No hyperdense vessel or unexpected calcification.   Skull: No fracture or focal lesion.   Sinuses/Orbits: No acute finding. Paranasal sinuses and mastoid air cells are well aerated.   Other: None.   IMPRESSION: No acute intracranial abnormality.   MRI cervical spine (12/23/2002): FINDINGS:  THERE IS REVERSAL OF THE NORMAL CERVICAL LORDOSIS CENTERED AT THE C5 LEVEL.  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  THE  PREVERTEBRAL SOFT TISSUES AND VISUALIZED PORTION OF THE POSTERIOR FOSSA ARE NORMAL.  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  C2-3:   NORMAL.  C3-4:   THERE IS  A SMALL POSTERIOR DISK/OSTEOPHYTE COMPLEX WITH NO SUBSEQUENT CENTRAL CANAL  STENOSIS OR NEURAL FORAMINAL NARROWING.  C4-5, C5-6, AND C6-7:   THERE ARE MODERATE SIZED POSTERIOR DISK/OSTEOPHYTE COMPLEXES WHICH EFFACE  THE VENTRAL SUBARACHNOID SPACE AND FLATTEN THE VENTRAL ASPECT OF THE CORD.  NO SIGNIFICANT NEURAL  FORAMINAL NARROWING IS IDENTIFIED.  THERE IS NO ABNORMAL CORD SIGNAL.  C7-T1:   NORMAL.   IMPRESSION  1.  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  2.  THERE ARE MODERATE SIZED POSTERIOR DISK/OSTEOPHYTE COMPLEXES AT C4-5, C5-6, AND C6-7 WHICH RESULT IN MILD CENTRAL CANAL NARROWING; HOWEVER, THERE IS NO ABNORMAL CORD SIGNAL AND NO SIGNIFICANT NEURAL FORAMINAL NARROWING BILATERALLY.  Cervical spine xray (07/08/23): FINDINGS: No fracture, dislocation or subluxation. No spondylolisthesis. No osteolytic or osteoblastic changes. Prevertebral and cervical cranial soft tissues are unremarkable.   Degenerative disc disease noted with disc space narrowing and marginal osteophytes at C3 through C7. Osteoarthritis at C1-C2.   IMPRESSION: Degenerative changes. No acute osseous abnormalities.   Lumbar spine xray (07/08/23): FINDINGS: There is no evidence of lumbar spine fracture. Alignment is normal. Degenerative changes at each lumbar level with disc space narrowing and marginal osteophytes. Facet joint degenerative changes L4-5 and L5-S1. Aortoiliac atheromatous calcifications noted.   IMPRESSION: Degenerative changes. No acute osseous abnormalities.  Assessment/Plan:  This is Christie Harris, a 83 y.o. female with imbalance and falls. Patient's symptoms are likely multifactorial with contributions for diabetic neuropathy, poor vision, hx of B12 deficiency, and possibly cervical spine stenosis/myelopathy. She has not had recent MRI cervical spine, so it is reasonable to repeat to evaluate for possible contribution from cervical spine.   Plan: -MRI cervical spine wo contrast -Fall precautions discussed. We discussed using cane or even rollator if needed to avoid falls -Agree with PT ordered by Dr. Rexanne - starting 09/22/24 -Continue B12 1000 mcg daily  Return to clinic in 6 months  Total time spent reviewing records, interview, history/exam, documentation, and coordination of care on day of encounter:  35 min  Venetia Potters, MD

## 2024-09-02 DIAGNOSIS — Z1231 Encounter for screening mammogram for malignant neoplasm of breast: Secondary | ICD-10-CM | POA: Diagnosis not present

## 2024-09-09 ENCOUNTER — Encounter: Payer: Self-pay | Admitting: Neurology

## 2024-09-09 ENCOUNTER — Ambulatory Visit: Payer: Medicare PPO | Admitting: Neurology

## 2024-09-09 VITALS — BP 147/86 | HR 88 | Ht 65.0 in | Wt 185.4 lb

## 2024-09-09 DIAGNOSIS — R2681 Unsteadiness on feet: Secondary | ICD-10-CM

## 2024-09-09 DIAGNOSIS — M542 Cervicalgia: Secondary | ICD-10-CM | POA: Diagnosis not present

## 2024-09-09 DIAGNOSIS — I951 Orthostatic hypotension: Secondary | ICD-10-CM | POA: Diagnosis not present

## 2024-09-09 DIAGNOSIS — E1142 Type 2 diabetes mellitus with diabetic polyneuropathy: Secondary | ICD-10-CM

## 2024-09-09 DIAGNOSIS — G4733 Obstructive sleep apnea (adult) (pediatric): Secondary | ICD-10-CM

## 2024-09-09 DIAGNOSIS — E538 Deficiency of other specified B group vitamins: Secondary | ICD-10-CM | POA: Diagnosis not present

## 2024-09-09 NOTE — Patient Instructions (Addendum)
 I am ordering an MRI of your neck (cervical spine). A referral to [facility]) has been placed for your MRI someone will contact you directly to schedule your appt. They are located at [address] [facility]. Please contact them directly by calling [phone] with any questions regarding your referral.  If you do not hear from someone to schedule in 1-2 weeks, please let me know.   I will be in touch when I have those results.  I agree with more physical therapy.  Continue B12 1000 mcg daily.  I will see you again in 6 months.  The physicians and staff at [facility] are committed to providing excellent care. You may receive a survey requesting feedback about your experience at our office. We strive to receive very good responses to the survey questions. If you feel that your experience would prevent you from giving the office a very good  response, please contact our office to try to remedy the situation. We may be reached at [phone]. Thank you for taking the time out of your busy day to complete the survey.  Venetia Potters, MD [facility]  Preventing Falls at [facility] are common, often dreaded events in the lives of older people. Aside from the obvious injuries and even death that may result, fall can cause wide-ranging consequences including loss of independence, mental decline, decreased activity and mobility. Younger people are also at risk of falling, especially those with chronic illnesses and fatigue.  Ways to reduce risk for falling Examine diet and medications. Warm foods and alcohol dilate blood vessels, which can lead to dizziness when standing. Sleep aids, antidepressants and pain medications can also increase the likelihood of a fall.  Get a vision exam. Poor vision, cataracts and glaucoma increase the chances of falling.  Check foot gear. Shoes should fit snugly and have a sturdy, nonskid sole and a broad, low heel  Participate in a  physician-approved exercise program to build and maintain muscle strength and improve balance and coordination. Programs that use ankle weights or stretch bands are excellent for muscle-strengthening. Water aerobics programs and low-impact Tai Chi programs have also been shown to improve balance and coordination.  Increase vitamin D  intake. Vitamin D  improves muscle strength and increases the amount of calcium the body is able to absorb and deposit in bones.  How to prevent falls from common hazards Floors - Remove all loose wires, cords, and throw rugs. Minimize clutter. Make sure rugs are anchored and smooth. Keep furniture in its usual place.  Chairs -- Use chairs with straight backs, armrests and firm seats. Add firm cushions to existing pieces to add height.  Bathroom - Install grab bars and non-skid tape in the tub or shower. Use a bathtub transfer bench or a shower chair with a back support Use an elevated toilet seat and/or safety rails to assist standing from a low surface. Do not use towel racks or bathroom tissue holders to help you stand.  Lighting - Make sure halls, stairways, and entrances are well-lit. Install a night light in your bathroom or hallway. Make sure there is a light switch at the top and bottom of the staircase. Turn lights on if you get up in the middle of the night. Make sure lamps or light switches are within reach of the bed if you have to get up during the night.  Kitchen - Install non-skid rubber mats near the sink and stove. Clean spills immediately. Store frequently used utensils,  pots, pans between waist and eye level. This helps prevent reaching and bending. Sit when getting things out of lower cupboards.  Living room/ Bedrooms - Place furniture with wide spaces in between, giving enough room to move around. Establish a route through the living room that gives you something to hold onto as you walk.  Stairs - Make sure treads, rails, and rugs are secure. Install  a rail on both sides of the stairs. If stairs are a threat, it might be helpful to arrange most of your activities on the lower level to reduce the number of times you must climb the stairs.  Entrances and doorways - Install metal handles on the walls adjacent to the doorknobs of all doors to make it more secure as you travel through the doorway.  Tips for maintaining balance Keep at least one hand free at all times. Try using a backpack or fanny pack to hold things rather than carrying them in your hands. Never carry objects in both hands when walking as this interferes with keeping your balance.  Attempt to swing both arms from front to back while walking. This might require a conscious effort if Parkinson's disease has diminished your movement. It will, however, help you to maintain balance and posture, and reduce fatigue.  Consciously lift your feet off of the ground when walking. Shuffling and dragging of the feet is a common culprit in losing your balance.  When trying to navigate turns, use a U technique of facing forward and making a wide turn, rather than pivoting sharply.  Try to stand with your feet shoulder-length apart. When your feet are close together for any length of time, you increase your risk of losing your balance and falling.  Do one thing at a time. Don't try to walk and accomplish another task, such as reading or looking around. The decrease in your automatic reflexes complicates motor function, so the less distraction, the better.  Do not wear rubber or gripping soled shoes, they might catch on the floor and cause tripping.  Move slowly when changing positions. Use deliberate, concentrated movements and, if needed, use a grab bar or walking aid. Count 15 seconds between each movement. For example, when rising from a seated position, wait 15 seconds after standing to begin walking.  If balance is a continuous problem, you might want to consider a walking aid such as a  cane, walking stick, or walker. Once you've mastered walking with help, you might be ready to try it on your own again.

## 2024-09-15 ENCOUNTER — Ambulatory Visit: Admitting: Podiatry

## 2024-09-15 DIAGNOSIS — E1142 Type 2 diabetes mellitus with diabetic polyneuropathy: Secondary | ICD-10-CM

## 2024-09-15 DIAGNOSIS — M79676 Pain in unspecified toe(s): Secondary | ICD-10-CM

## 2024-09-15 DIAGNOSIS — B351 Tinea unguium: Secondary | ICD-10-CM

## 2024-09-19 ENCOUNTER — Encounter: Payer: Self-pay | Admitting: Podiatry

## 2024-09-19 NOTE — Progress Notes (Signed)
"  °  Subjective:  Patient ID: Christie Harris, female    DOB: Feb 14, 1941,  MRN: [id number]  Christie Harris presents to clinic today for at risk foot care with history of diabetic neuropathy and painful mycotic toenails x 10 which interfere with daily activities. Pain is relieved with periodic professional debridement.  Chief Complaint  Patient presents with   [facility]    [facility] A1c 6.5 ,Glucose 102 04/15/24 Rexanne Ingle, MD (PCP), 05/19/24    New problem(s): None.   PCP is Rexanne Ingle, MD.  Allergies[1]  Review of Systems: Negative except as noted in the HPI.  Objective:  There were no vitals filed for this visit. Christie Harris is a pleasant 83 y.o. female WD, WN in NAD. AAO x 3.  Vascular Examination: Capillary refill time immediate b/l. Palpable pedal pulses. Pedal hair present b/l. Pedal edema absent b/l. No pain with calf compression b/l. Skin temperature gradient WNL b/l. No cyanosis or clubbing b/l. No ischemia or gangrene noted b/l.   Neurological Examination: Protective sensation diminished with 10g monofilament b/l.  Dermatological Examination: Pedal skin with normal turgor, texture and tone b/l.  No open wounds. No interdigital macerations.   Toenails 1-5 b/l thick, discolored, elongated with subungual debris and pain on dorsal palpation. .  Musculoskeletal Examination: Muscle strength 5/5 to all lower extremity muscle groups bilaterally. HAV with bunion deformity noted b/l LE.  Radiographs: None  Assessment/Plan: 1. Pain due to onychomycosis of toenail   2. Diabetic polyneuropathy associated with type 2 diabetes mellitus [facility])   Consent given for treatment. Patient examined. All patient's and/or POA's questions/concerns addressed on today's visit. Mycotic toenails 1-5 b/l debrided in length and girth without incident. Continue foot and shoe inspections daily. Monitor blood glucose per PCP/Endocrinologist's recommendations.Continue soft, supportive shoe gear daily.  Report any pedal injuries to medical professional. Call office if there are any quesitons/concerns. -Patient/POA to call should there be question/concern in the interim.   Return in about 3 months (around 12/14/2024).  Delon LITTIE Merlin, DPM      [facility] LOCATION: [address] [place] [address]                   Office [phone]   [place] LOCATION: [address] [place], [place] [address] Office [phone]     [1]  Allergies Allergen Reactions   Darvocet [Propoxyphene N-Acetaminophen] Other (See Comments)    out of my mind   Scallops [Shellfish Allergy] Nausea And Vomiting   "

## 2024-10-01 ENCOUNTER — Encounter: Payer: Self-pay | Admitting: Neurology

## 2024-10-02 ENCOUNTER — Ambulatory Visit
Admission: RE | Admit: 2024-10-02 | Discharge: 2024-10-02 | Disposition: A | Source: Ambulatory Visit | Attending: Neurology

## 2024-10-02 DIAGNOSIS — R2681 Unsteadiness on feet: Secondary | ICD-10-CM

## 2024-10-02 DIAGNOSIS — E1142 Type 2 diabetes mellitus with diabetic polyneuropathy: Secondary | ICD-10-CM

## 2024-10-02 DIAGNOSIS — M542 Cervicalgia: Secondary | ICD-10-CM

## 2024-10-02 DIAGNOSIS — E538 Deficiency of other specified B group vitamins: Secondary | ICD-10-CM

## 2024-10-02 DIAGNOSIS — I951 Orthostatic hypotension: Secondary | ICD-10-CM

## 2024-10-02 DIAGNOSIS — G4733 Obstructive sleep apnea (adult) (pediatric): Secondary | ICD-10-CM

## 2024-10-07 ENCOUNTER — Ambulatory Visit: Payer: Self-pay | Admitting: Neurology

## 2024-10-08 ENCOUNTER — Telehealth: Payer: Self-pay

## 2024-10-08 NOTE — Telephone Encounter (Signed)
 Called and informed pt of results per Dr. Leigh she could not see it on My Chart. She understood and wanted to know more about the scattered thyroid  nodules Dr. Leigh looked at it and reported to let her primary know about it so he is aware of it , she understood and had no more questions or concerns.

## 2024-10-08 NOTE — Telephone Encounter (Signed)
 Access Nurse: Pt states she wants more info on her MRI and assistance on how to see the results

## 2024-10-14 ENCOUNTER — Emergency Department (HOSPITAL_COMMUNITY)
Admission: EM | Admit: 2024-10-14 | Discharge: 2024-10-15 | Disposition: A | Source: Home / Self Care | Attending: Emergency Medicine | Admitting: Emergency Medicine

## 2024-10-14 ENCOUNTER — Emergency Department (HOSPITAL_COMMUNITY)

## 2024-10-14 ENCOUNTER — Encounter (HOSPITAL_COMMUNITY): Payer: Self-pay

## 2024-10-14 ENCOUNTER — Other Ambulatory Visit: Payer: Self-pay

## 2024-10-14 DIAGNOSIS — W19XXXA Unspecified fall, initial encounter: Secondary | ICD-10-CM

## 2024-10-14 DIAGNOSIS — M542 Cervicalgia: Secondary | ICD-10-CM | POA: Diagnosis not present

## 2024-10-14 DIAGNOSIS — Y9301 Activity, walking, marching and hiking: Secondary | ICD-10-CM | POA: Insufficient documentation

## 2024-10-14 DIAGNOSIS — M25529 Pain in unspecified elbow: Secondary | ICD-10-CM | POA: Diagnosis not present

## 2024-10-14 DIAGNOSIS — E041 Nontoxic single thyroid nodule: Secondary | ICD-10-CM | POA: Insufficient documentation

## 2024-10-14 DIAGNOSIS — S0003XA Contusion of scalp, initial encounter: Secondary | ICD-10-CM | POA: Insufficient documentation

## 2024-10-14 DIAGNOSIS — W01198A Fall on same level from slipping, tripping and stumbling with subsequent striking against other object, initial encounter: Secondary | ICD-10-CM | POA: Insufficient documentation

## 2024-10-14 DIAGNOSIS — Y92018 Other place in single-family (private) house as the place of occurrence of the external cause: Secondary | ICD-10-CM | POA: Diagnosis not present

## 2024-10-14 DIAGNOSIS — Z85828 Personal history of other malignant neoplasm of skin: Secondary | ICD-10-CM | POA: Diagnosis not present

## 2024-10-14 NOTE — Discharge Instructions (Addendum)
 You were evaluated in the Emergency Department and after careful evaluation, we did not find any emergent condition requiring admission or further testing in the hospital.  Your exam/testing today was overall reassuring.  CT imaging did not show any significant traumatic injuries.  Recommend cold compresses and Tylenol as needed for discomfort as we discussed.  Follow-up with your primary care doctor to discuss the thyroid  nodules, they may consider further testing of your thyroid .  Please return to the Emergency Department if you experience any worsening of your condition.  Thank you for allowing us  to be a part of your care.

## 2024-10-14 NOTE — ED Triage Notes (Addendum)
 BIBA from home, walking in the living room, tripped over rug small laceration to the back of her head.  Bleeding controled.  C/O back of the head pain and elbow pain.  No diagnosed HTN.  Denies neck/back pain, No LOC, No N/V   Hx Orthostatic hypo tension, DM2  170/90 87 HR 16 RR 97 RA 187 CBG

## 2024-10-14 NOTE — ED Provider Notes (Addendum)
 " [facility]PT [facility] Emergency Department Provider Note MRN:  [id number]  Arrival date & time: 10/15/24     Chief Complaint   Fall   History of Present Illness   Christie Harris is a 84 y.o. year-old female with no pertinent past medical history presenting to the ED with chief complaint of fall.  Walking over a rug patient explains that she lost her balance and fell backwards striking the back of her head against the ground.  Also having some neck discomfort.  Mild elbow pain.  No back pain, no chest pain or shortness of breath, no abdominal pain, no leg injuries.  Denies blood thinners.  Review of Systems  A thorough review of systems was obtained and all systems are negative except as noted in the HPI and PMH.   Patient's Health History    Past Medical History:  Diagnosis Date   Anxiety    Arthritis    greater left hip   CA of skin    squamous cell right leg-recent excision   Depression    Depression    GERD (gastroesophageal reflux disease)    Hypercholesteremia    Obesity    Osteoarthrosis    Osteoporosis    Shortness of breath    on exertion   Sleep apnea    CPAP at 13cm H2o   Sleep apnea     Past Surgical History:  Procedure Laterality Date   ABDOMINAL HYSTERECTOMY     uterus and ovaries   CATARACT EXTRACTION Right    CHOLECYSTECTOMY     COLONOSCOPY WITH PROPOFOL  N/A 03/23/2013   Procedure: COLONOSCOPY WITH PROPOFOL ;  Surgeon: Gladis MARLA Louder, MD;  Location: [facility] ENDOSCOPY;  Service: Endoscopy;  Laterality: N/A;colon polyps removed-benign   ESOPHAGOGASTRODUODENOSCOPY (EGD) WITH PROPOFOL  N/A 06/16/2014   Procedure: ESOPHAGOGASTRODUODENOSCOPY (EGD) WITH PROPOFOL ;  Surgeon: Gladis MARLA Louder, MD;  Location: [facility];  Service: Endoscopy;  Laterality: N/A;   plastic surgery on nose     deviated septum and cosmetic    Family History  Problem Relation Age of Onset   Breast cancer Mother    Diabetes Father    Congestive Heart Failure Father     Hypertension Father    Lung cancer Sister    Heart attack Brother    Kidney disease Brother    Hypertension Brother    Kidney disease Brother     Social History   Socioeconomic History   Marital status: Divorced    Spouse name: Not on file   Number of children: Not on file   Years of education: Not on file   Highest education level: Not on file  Occupational History   Not on file  Tobacco Use   Smoking status: Never    Passive exposure: Past   Smokeless tobacco: Never  Vaping Use   Vaping status: Never Used  Substance and Sexual Activity   Alcohol use: Yes    Comment: occ   Drug use: No   Sexual activity: Not Currently  Other Topics Concern   Not on file  Social History Narrative   Right handed   Caffeine 1-2 mug in am 2 coke in pm    Live in a two story home with daughter   Social Drivers of Health   Tobacco Use: Low Risk (10/14/2024)   Patient History    Smoking Tobacco Use: Never    Smokeless Tobacco Use: Never    Passive Exposure: Past  Financial Resource Strain: Not on file  Food Insecurity: Not on file  Transportation Needs: Not on file  Physical Activity: Not on file  Stress: Not on file  Social Connections: Not on file  Intimate Partner Violence: Not on file  Depression (PHQ2-9): Not on file  Alcohol Screen: Not on file  Housing: Not on file  Utilities: Not on file  Health Literacy: Not on file     Physical Exam   Vitals:   10/14/24 2242  BP: (!) 179/82  Pulse: 92  Resp: 16  Temp: 97.7 F (36.5 C)  SpO2: 100%    CONSTITUTIONAL: Well-appearing, NAD NEURO/PSYCH:  Alert and oriented x 3, no focal deficits EYES:  eyes equal and reactive ENT/NECK:  no LAD, no JVD CARDIO: Regular rate, well-perfused, normal S1 and S2 PULM:  CTAB no wheezing or rhonchi GI/GU:  non-distended, non-tender MSK/SPINE:  No gross deformities, no edema SKIN: Hematoma to the occipital scalp with overlying abrasion, hemostatic   *Additional and/or pertinent  findings included in MDM below  Diagnostic and Interventional Summary    EKG Interpretation Date/Time:    Ventricular Rate:    PR Interval:    QRS Duration:    QT Interval:    QTC Calculation:   R Axis:      Text Interpretation:         Labs Reviewed - No data to display  CT HEAD WO CONTRAST ( )  Final Result    CT CERVICAL SPINE WO CONTRAST  Final Result      Medications - No data to display   Procedures  /  Critical Care Procedures  ED Course and Medical Decision Making  Initial Impression and Ddx Patient describes a mechanical fall with head trauma, given patient's age obtaining CT imaging of the head and cervical spine.  Seems to have preserved range of motion of the neck, not having any other concerning symptoms to suggest significant traumatic injury.  Past medical/surgical history that increases complexity of ED encounter: None  Interpretation of Diagnostics I personally reviewed the CT imaging and my interpretation is as follows: No fractures or intracranial bleeding    Patient Reassessment and Ultimate Disposition/Management     Discharged with return precautions.  Patient management required discussion with the following services or consulting groups:  None  Complexity of Problems Addressed Acute illness or injury that poses threat of life of bodily function  Additional Data Reviewed and Analyzed Further history obtained from: Further history from spouse/family member  Additional Factors Impacting ED Encounter Risk Consideration of hospitalization  Ozell HERO. Theadore, MD [facility] Emergency Medicine [facility] [email or web address]  Final Clinical Impressions(s) / ED Diagnoses     ICD-10-CM   1. Fall, initial encounter  W19.XXXA     2. Hematoma of scalp, initial encounter  S00.03XA     3. Thyroid  nodule  E04.1       ED Discharge Orders     None        Discharge Instructions Discussed with and Provided to  Patient:     Discharge Instructions      You were evaluated in the Emergency Department and after careful evaluation, we did not find any emergent condition requiring admission or further testing in the hospital.  Your exam/testing today was overall reassuring.  CT imaging did not show any significant traumatic injuries.  Recommend cold compresses and Tylenol as needed for discomfort as we discussed.  Follow-up with your primary care doctor to discuss the thyroid  nodules, they may consider further testing of  your thyroid .  Please return to the Emergency Department if you experience any worsening of your condition.  Thank you for allowing us  to be a part of your care.        Theadore Ozell HERO, MD 10/14/24 2359    Theadore Ozell HERO, MD 10/15/24 0020  "

## 2025-01-05 ENCOUNTER — Ambulatory Visit: Admitting: Podiatry

## 2025-03-16 ENCOUNTER — Ambulatory Visit: Payer: Self-pay | Admitting: Neurology
